# Patient Record
Sex: Male | Born: 1960 | Race: White | Hispanic: No | Marital: Married | Smoking: Former smoker
Health system: Southern US, Community
[De-identification: ages and names within clinical notes are randomized; demographics above are authoritative.]

## PROBLEM LIST (undated history)

## (undated) DIAGNOSIS — I10 Essential (primary) hypertension: Secondary | ICD-10-CM

## (undated) DIAGNOSIS — I2510 Atherosclerotic heart disease of native coronary artery without angina pectoris: Secondary | ICD-10-CM

## (undated) DIAGNOSIS — K219 Gastro-esophageal reflux disease without esophagitis: Secondary | ICD-10-CM

## (undated) DIAGNOSIS — C801 Malignant (primary) neoplasm, unspecified: Secondary | ICD-10-CM

## (undated) HISTORY — PX: EYE SURGERY: SHX253

## (undated) HISTORY — PX: CATARACT EXTRACTION: SUR2

## (undated) HISTORY — PX: SHOULDER SURGERY: SHX246

## (undated) HISTORY — PX: OTHER SURGICAL HISTORY: SHX169

## (undated) HISTORY — PX: VASECTOMY: SHX75

## (undated) HISTORY — PX: BACK SURGERY: SHX140

---

## 2000-09-14 ENCOUNTER — Ambulatory Visit (HOSPITAL_COMMUNITY): Admission: RE | Admit: 2000-09-14 | Discharge: 2000-09-14 | Payer: Self-pay | Admitting: Family Medicine

## 2000-09-14 ENCOUNTER — Encounter: Payer: Self-pay | Admitting: Family Medicine

## 2000-11-03 ENCOUNTER — Encounter (HOSPITAL_COMMUNITY): Admission: RE | Admit: 2000-11-03 | Discharge: 2000-12-03 | Payer: Self-pay | Admitting: Neurosurgery

## 2000-11-25 ENCOUNTER — Encounter: Payer: Self-pay | Admitting: Neurosurgery

## 2000-11-25 ENCOUNTER — Inpatient Hospital Stay (HOSPITAL_COMMUNITY): Admission: RE | Admit: 2000-11-25 | Discharge: 2000-11-25 | Payer: Self-pay | Admitting: Neurosurgery

## 2002-08-02 ENCOUNTER — Encounter: Payer: Self-pay | Admitting: Family Medicine

## 2002-08-02 ENCOUNTER — Ambulatory Visit (HOSPITAL_COMMUNITY): Admission: RE | Admit: 2002-08-02 | Discharge: 2002-08-02 | Payer: Self-pay | Admitting: Family Medicine

## 2002-08-08 ENCOUNTER — Ambulatory Visit (HOSPITAL_COMMUNITY): Admission: RE | Admit: 2002-08-08 | Discharge: 2002-08-08 | Payer: Self-pay | Admitting: Family Medicine

## 2004-05-25 ENCOUNTER — Observation Stay (HOSPITAL_COMMUNITY): Admission: EM | Admit: 2004-05-25 | Discharge: 2004-05-27 | Payer: Self-pay | Admitting: *Deleted

## 2008-05-08 ENCOUNTER — Ambulatory Visit (HOSPITAL_COMMUNITY): Admission: RE | Admit: 2008-05-08 | Discharge: 2008-05-08 | Payer: Self-pay | Admitting: Family Medicine

## 2010-09-27 NOTE — Procedures (Signed)
   NAME:  Mark [name], Mark [name]                          ACCOUNT NO.:  [id number]   MEDICAL RECORD NO.:  [id number]                   PATIENT TYPE:  OUT   LOCATION:  [facility]                                 FACILITY:  [facility]   PHYSICIAN:  Scott A. Gerda Diss, M.D.               DATE OF BIRTH:  02-07-1961   DATE OF PROCEDURE:  08/08/2002  DATE OF DISCHARGE:                                    STRESS TEST   INDICATIONS:  Atypical chest pain left side over the past few weeks but has  been doing a lot of weightlifting.  Does have risk factors and family  history.   FINDINGS:  1. Baseline EKG:  No acute changes.  2. ST segment response to exercise:  The patient had some changes that     occurred as the test went on with progressive ST segment depression with     downsloping of V5 and upsloping V4, and flattening in V6.  The patient     did meet criteria of ST segment depression during exercise, felt no     symptomatology.  3. Recovery phase:  Quickly the ST segments returned back to normal over the     course of the next three-and-a-half minutes, were upsloping, and there     were no worrisome progressions.  4. Arrhythmias:  None.  5. Blood pressure response:  The patient did reach maximum of 190/90 blood     pressure.   INTERPRETATION:  Abnormal stress test.  Certainly this could be a false  positive, but given his risk factors I do feel this needs to be checked out  further.  I think a stress Cardiolite would be the next best step.  Will go  ahead and get him set up with cardiology.  He will let us know of the  cardiologist he would like to see and we will assist him with getting that  set up.                                               Scott A. Gerda Diss, M.D.    Linus Orn  D:  08/08/2002  T:  08/08/2002  Job:  [id number]

## 2010-11-21 ENCOUNTER — Encounter (INDEPENDENT_AMBULATORY_CARE_PROVIDER_SITE_OTHER): Payer: Self-pay | Admitting: Internal Medicine

## 2011-02-27 ENCOUNTER — Other Ambulatory Visit (INDEPENDENT_AMBULATORY_CARE_PROVIDER_SITE_OTHER): Payer: Self-pay | Admitting: *Deleted

## 2011-02-27 DIAGNOSIS — Z1211 Encounter for screening for malignant neoplasm of colon: Secondary | ICD-10-CM

## 2011-03-18 MED ORDER — SODIUM CHLORIDE 0.45 % IV SOLN: Freq: Once | INTRAVENOUS | Status: DC

## 2011-03-19 ENCOUNTER — Encounter (INDEPENDENT_AMBULATORY_CARE_PROVIDER_SITE_OTHER): Payer: Self-pay | Admitting: Internal Medicine

## 2011-03-19 ENCOUNTER — Other Ambulatory Visit (INDEPENDENT_AMBULATORY_CARE_PROVIDER_SITE_OTHER): Payer: Self-pay | Admitting: Internal Medicine

## 2011-03-19 ENCOUNTER — Encounter (HOSPITAL_COMMUNITY)
Admission: RE | Disposition: A | Discharge status: Home Health | Payer: Self-pay | Source: Ambulatory Visit | Attending: Internal Medicine

## 2011-03-19 ENCOUNTER — Encounter (HOSPITAL_COMMUNITY): Payer: Self-pay | Admitting: *Deleted

## 2011-03-19 ENCOUNTER — Ambulatory Visit (HOSPITAL_COMMUNITY)
Admission: RE | Admit: 2011-03-19 | Discharge: 2011-03-19 | Disposition: A | Discharge status: Home Health | Payer: PRIVATE HEALTH INSURANCE | Source: Ambulatory Visit | Attending: Internal Medicine | Admitting: Internal Medicine

## 2011-03-19 DIAGNOSIS — D126 Benign neoplasm of colon, unspecified: Secondary | ICD-10-CM | POA: Insufficient documentation

## 2011-03-19 DIAGNOSIS — K644 Residual hemorrhoidal skin tags: Secondary | ICD-10-CM | POA: Insufficient documentation

## 2011-03-19 DIAGNOSIS — Z1211 Encounter for screening for malignant neoplasm of colon: Secondary | ICD-10-CM | POA: Insufficient documentation

## 2011-03-19 DIAGNOSIS — K5730 Diverticulosis of large intestine without perforation or abscess without bleeding: Secondary | ICD-10-CM

## 2011-03-19 HISTORY — DX: Gastro-esophageal reflux disease without esophagitis: K21.9

## 2011-03-19 HISTORY — DX: Malignant (primary) neoplasm, unspecified: C80.1

## 2011-03-19 HISTORY — PX: COLONOSCOPY: SHX5424

## 2011-03-19 SURGERY — COLONOSCOPY
Anesthesia: Moderate Sedation

## 2011-03-19 MED ORDER — MIDAZOLAM HCL 5 MG/5ML IJ SOLN
INTRAMUSCULAR | Status: AC
  Filled 2011-03-19: qty 10

## 2011-03-19 MED ORDER — MIDAZOLAM HCL 5 MG/5ML IJ SOLN
INTRAMUSCULAR | Status: DC | PRN
  Administered 2011-03-19 (×2): 2 mg via INTRAVENOUS
  Administered 2011-03-19 (×2): 1 mg via INTRAVENOUS

## 2011-03-19 MED ORDER — MEPERIDINE HCL 50 MG/ML IJ SOLN
INTRAMUSCULAR | Status: AC
  Filled 2011-03-19: qty 1

## 2011-03-19 MED ORDER — MEPERIDINE HCL 50 MG/ML IJ SOLN
INTRAMUSCULAR | Status: DC | PRN
  Administered 2011-03-19 (×2): 25 mg via INTRAVENOUS

## 2011-03-19 NOTE — H&P (Signed)
Mark [name] is an 50 y.o. male.   Chief Complaint: Patient is here for colonoscopy. HPI: Patient is 50 year old Caucasian male who is here for average risk screening colonoscopy. This is patient's first exam. He denies abdominal pain rectal bleeding or change in his bowel habits. Him history is negative for colorectal carcinoma.  Past Medical History  Diagnosis Date  . GERD (gastroesophageal reflux disease)   . Cancer 15-20 years ago    squamous cell carcinoma off face    Past Surgical History  Procedure Date  . Skin cancer removed from face   . Back surgery     Family History  Problem Relation Age of Onset  . Colon cancer Neg Hx    Social History:  reports that he has quit smoking. He does not have any smokeless tobacco history on file. He reports that he drinks alcohol. He reports that he does not use illicit drugs.  Allergies: No Known Allergies  Medications Prior to Admission  Medication Dose Route Frequency Provider Last Rate Last Dose  . 0.45 % sodium chloride infusion   Intravenous Once Malissa Hippo, MD      . meperidine (DEMEROL) 50 MG/ML injection           . midazolam (VERSED) 5 MG/5ML injection            Medications Prior to Admission  Medication Sig Dispense Refill  . acetaminophen (TYLENOL) 325 MG tablet Take 650 mg by mouth every 6 (six) hours as needed. For pain       . Multiple Vitamins-Minerals (MULTIVITAMINS THER. W/MINERALS) TABS Take 1 tablet by mouth daily.          No results found for this or any previous visit (from the past 48 hour(s)). No results found.  Review of Systems  Constitutional: Negative for weight loss.  Gastrointestinal: Negative for abdominal pain, diarrhea, constipation, blood in stool and melena.    Blood pressure 129/84, pulse 69, temperature 97.9 F (36.6 C), temperature source Oral, resp. rate 12, height 5\' 8"  (1.727 m), weight 171 lb (77.565 kg), SpO2 99.00%. Physical Exam  Constitutional: He appears well-developed  and well-nourished.  HENT:  Mouth/Throat: Oropharynx is clear and moist.  Eyes: Conjunctivae are normal. No scleral icterus.  Neck: No thyromegaly present.  Cardiovascular: Normal rate, regular rhythm and normal heart sounds.   Respiratory: Effort normal and breath sounds normal.  GI: Soft. He exhibits no distension and no mass. There is no tenderness.  Lymphadenopathy:    He has no cervical adenopathy.  Neurological: He is alert.  Skin: Skin is warm and dry.     Assessment/Plan Average risk screening colonoscopy.  Darielle Hancher U 03/19/2011, 9:45 AM

## 2011-03-19 NOTE — Op Note (Signed)
COLONOSCOPY PROCEDURE REPORT  PATIENT:  Mark [name]  MR#:  [id number] Birthdate:  Sep 10, 1960, 50 y.o., male Endoscopist:  Dr. Malissa Hippo, MD Referred By:  Dr. Lilyan Punt, MD Procedure Date: 03/19/2011  Procedure:   Colonoscopy  Indications: Patient is 50 year old Caucasian male who is undergoing average risk screening colonoscopy.  Informed Consent:  Procedure and risks were reviewed with the patient and informed consent was obtained. Medications:  Demerol 50 mg IV Versed 6 mg IV  Description of procedure:  After a digital rectal exam was performed, that colonoscope was advanced from the anus through the rectum and colon to the area of the cecum, ileocecal valve and appendiceal orifice. The cecum was deeply intubated. These structures were well-seen and photographed for the record. From the level of the cecum and ileocecal valve, the scope was slowly and cautiously withdrawn. The mucosal surfaces were carefully surveyed utilizing scope tip to flexion to facilitate fold flattening as needed. The scope was pulled down into the rectum where a thorough exam including retroflexion was performed.  Findings:   Prep excellent. Two small diverticula at sigmoid colon. 4 mm  polyp at mid sigmoid colon. It was ablated via cold biopsy. Small hemorrhoids below the dentate line.  Therapeutic/Diagnostic Maneuvers Performed:  See above  Complications:  None  Cecal Withdrawal Time:  15 minutes  Impression:  Examination performed to cecum. Small polyp ablated via cold biopsy from sigmoid colon. Small diverticula at sigmoid colon and small external hemorrhoids.  Recommendations:  Standard instructions given.  I will be contacting patient with results of biopsy.  REHMAN,NAJEEB U  03/19/2011 10:17 AM  CC: Dr. Lilyan Punt, MD, MD & Dr. Bonnetta Barry ref. provider found

## 2011-03-26 ENCOUNTER — Encounter (INDEPENDENT_AMBULATORY_CARE_PROVIDER_SITE_OTHER): Payer: Self-pay | Admitting: *Deleted

## 2011-03-28 ENCOUNTER — Encounter (HOSPITAL_COMMUNITY): Payer: Self-pay | Admitting: Internal Medicine

## 2013-06-16 ENCOUNTER — Telehealth: Payer: Self-pay | Admitting: Family Medicine

## 2013-06-16 DIAGNOSIS — Z0000 Encounter for general adult medical examination without abnormal findings: Secondary | ICD-10-CM

## 2013-06-16 DIAGNOSIS — Z79899 Other long term (current) drug therapy: Secondary | ICD-10-CM

## 2013-06-16 DIAGNOSIS — Z125 Encounter for screening for malignant neoplasm of prostate: Secondary | ICD-10-CM

## 2013-06-16 NOTE — Telephone Encounter (Signed)
Bloodwork ordered in the system. Patient was notified.  

## 2013-06-16 NOTE — Telephone Encounter (Signed)
Lipid, liver, PSA, metabolic 7

## 2013-06-16 NOTE — Telephone Encounter (Signed)
Patient needs order for blood work. °

## 2013-06-28 ENCOUNTER — Encounter: Payer: PRIVATE HEALTH INSURANCE | Admitting: Family Medicine

## 2013-07-14 LAB — BASIC METABOLIC PANEL
BUN: 16 mg/dL (ref 6–23)
CHLORIDE: 104 meq/L (ref 96–112)
CO2: 28 mEq/L (ref 19–32)
CREATININE: 0.99 mg/dL (ref 0.50–1.35)
Calcium: 9.3 mg/dL (ref 8.4–10.5)
Glucose, Bld: 102 mg/dL — ABNORMAL HIGH (ref 70–99)
Potassium: 4.4 mEq/L (ref 3.5–5.3)
Sodium: 141 mEq/L (ref 135–145)

## 2013-07-14 LAB — HEPATIC FUNCTION PANEL
ALBUMIN: 4.1 g/dL (ref 3.5–5.2)
ALT: 18 U/L (ref 0–53)
AST: 22 U/L (ref 0–37)
Alkaline Phosphatase: 56 U/L (ref 39–117)
BILIRUBIN TOTAL: 0.2 mg/dL (ref 0.2–1.2)
Bilirubin, Direct: <0.1 mg/dL (ref 0.0–0.3)
Total Protein: 6.8 g/dL (ref 6.0–8.3)

## 2013-07-14 LAB — LIPID PANEL
CHOL/HDL RATIO: 3.5 ratio
Cholesterol: 166 mg/dL (ref 0–200)
HDL: 48 mg/dL (ref >39)
LDL CALC: 105 mg/dL — AB (ref 0–99)
Triglycerides: 67 mg/dL (ref <150)
VLDL: 13 mg/dL (ref 0–40)

## 2013-07-15 LAB — PSA: PSA: 0.47 ng/mL (ref <=4.00)

## 2013-07-19 ENCOUNTER — Encounter: Payer: Self-pay | Admitting: Family Medicine

## 2013-07-19 ENCOUNTER — Ambulatory Visit (INDEPENDENT_AMBULATORY_CARE_PROVIDER_SITE_OTHER): Payer: PRIVATE HEALTH INSURANCE | Admitting: Family Medicine

## 2013-07-19 VITALS — BP 128/80 | Ht 68.0 in | Wt 177.0 lb

## 2013-07-19 DIAGNOSIS — R7309 Other abnormal glucose: Secondary | ICD-10-CM

## 2013-07-19 DIAGNOSIS — R739 Hyperglycemia, unspecified: Secondary | ICD-10-CM

## 2013-07-19 DIAGNOSIS — E785 Hyperlipidemia, unspecified: Secondary | ICD-10-CM

## 2013-07-19 DIAGNOSIS — Z0000 Encounter for general adult medical examination without abnormal findings: Secondary | ICD-10-CM

## 2013-07-19 LAB — POCT GLYCOSYLATED HEMOGLOBIN (HGB A1C): Hemoglobin A1C: 5.6

## 2013-07-19 NOTE — Progress Notes (Signed)
   Subjective:    Patient ID: Mark [name], male    DOB: 1960-09-23, 53 y.o.   MRN: [id number]  HPI Patient is here today for a wellness exam.  He is getting over bronchitis. He went to the urgent care over the weekend and was prescribed ZPAK.  Patient denies any wheezing difficulty breathing currently Dietary safety measures reviewed patient doing well with this Denies any chest pain shortness of breath headaches  Review of Systems  Constitutional: Negative for fever, activity change and appetite change.  HENT: Negative for congestion and rhinorrhea.   Eyes: Negative for discharge.  Respiratory: Negative for cough and wheezing.   Cardiovascular: Negative for chest pain.  Gastrointestinal: Negative for vomiting, abdominal pain and blood in stool.  Genitourinary: Negative for frequency and difficulty urinating.  Musculoskeletal: Negative for neck pain.  Skin: Negative for rash.  Allergic/Immunologic: Negative for environmental allergies and food allergies.  Neurological: Negative for weakness and headaches.  Psychiatric/Behavioral: Negative for agitation.       Objective:   Physical Exam  Constitutional: He appears well-developed and well-nourished.  HENT:  Head: Normocephalic and atraumatic.  Right Ear: External ear normal.  Left Ear: External ear normal.  Nose: Nose normal.  Mouth/Throat: Oropharynx is clear and moist.  Eyes: EOM are normal. Pupils are equal, round, and reactive to light.  Neck: Normal range of motion. Neck supple. No thyromegaly present.  Cardiovascular: Normal rate, regular rhythm and normal heart sounds.   No murmur heard. Pulmonary/Chest: Effort normal and breath sounds normal. No respiratory distress. He has no wheezes.  Abdominal: Soft. Bowel sounds are normal. He exhibits no distension and no mass. There is no tenderness.  Genitourinary: Prostate normal.  Musculoskeletal: Normal range of motion. He exhibits no edema.  Lymphadenopathy:    He has  no cervical adenopathy.  Neurological: He is alert. He exhibits normal muscle tone.  Skin: Skin is warm and dry. No erythema.  Psychiatric: He has a normal mood and affect. His behavior is normal. Judgment normal.          Assessment & Plan:  hypergly- a1C fine- healthy eating rec Cholesterol slightly elevated but his overall risk is less than 2% statin not indicated.  The patient comes in today for a wellness visit.  A review of their health history was completed.  A review of medications was also completed. Any necessary refills were discussed. Sensible healthy diet was discussed. Importance of minimizing excessive salt and carbohydrates was also discussed. Safety was stressed including driving, activities at work and at home where applicable. Importance of regular physical activity for overall health was discussed. Preventative measures appropriate for age were discussed. Time was spent with the patient discussing any concerns they have about their well-being.   Patient has umbilical hernia if he gets worse patient will call us we'll set him up with general surgery for surgery.

## 2013-07-19 NOTE — Patient Instructions (Signed)
Diets for Diabetes, Food Labeling Look at food labels to help you decide how much of a product you can eat. You will want to check the amount of total carbohydrate in a serving to see how the food fits into your meal plan. In the list of ingredients, the ingredient present in the largest amount by weight must be listed first, followed by the other ingredients in descending order. STANDARD OF IDENTITY Most products have a list of ingredients. However, foods that the Food and Drug Administration (FDA) has given a standard of identity do not need a list of ingredients. A standard of identity means that a food must contain certain ingredients if it is called a particular name. Examples are mayonnaise, peanut butter, ketchup, jelly, and cheese. LABELING TERMS There are many terms found on food labels. Some of these terms have specific definitions. Some terms are regulated by the FDA, and the FDA has clearly specified how they can be used. Others are not regulated or well-defined and can be misleading and confusing. SPECIFICALLY DEFINED TERMS Nutritive Sweetener.  A sweetener that contains calories,such as table sugar or honey. Nonnutritive Sweetener.  A sweetener with few or no calories,such as saccharin, aspartame, sucralose, and cyclamate. LABELING TERMS REGULATED BY THE FDA Free.  The product contains only a tiny or small amount of fat, cholesterol, sodium, sugar, or calories. For example, a "fat-free" product will contain less than 0.5 g of fat per serving. Low.  A food described as "low" in fat, saturated fat, cholesterol, sodium, or calories could be eaten fairly often without exceeding dietary guidelines. For example, "low in fat" means no more than 3 g of fat per serving. Lean.  "Lean" and "extra lean" are U.S. Department of Agriculture (USDA) terms for use on meat and poultry products. "Lean" means the product contains less than 10 g of fat, 4 g of saturated fat, and 95 mg of cholesterol  per serving. "Lean" is not as low in fat as a product labeled "low." Extra Lean.  "Extra lean" means the product contains less than 5 g of fat, 2 g of saturated fat, and 95 mg of cholesterol per serving. While "extra lean" has less fat than "lean," it is still higher in fat than a product labeled "low." Reduced, Less, Fewer.  A diet product that contains 25% less of a nutrient or calories than the regular version. For example, hot dogs might be labeled "25% less fat than our regular hot dogs." Light/Lite.  A diet product that contains  fewer calories or  the fat of the original. For example, "light in sodium" means a product with  the usual sodium. More.  One serving contains at least 10% more of the daily value of a vitamin, mineral, or fiber than usual. Good Source Of.  One serving contains 10% to 19% of the daily value for a particular vitamin, mineral, or fiber. Excellent Source Of.  One serving contains 20% or more of the daily value for a particular nutrient. Other terms used might be "high in" or "rich in." Enriched or Fortified.  The product contains added vitamins, minerals, or protein. Nutrition labeling must be used on enriched or fortified foods. Imitation.  The product has been altered so that it is lower in protein, vitamins, or minerals than the usual food,such as imitation peanut butter. Total Fat.  The number listed is the total of all fat found in a serving of the product. Under total fat, food labels must list saturated fat and   trans fat, which are associated with raising bad cholesterol and an increased risk of heart blood vessel disease. °Saturated Fat. °· Mainly fats from animal-based sources. Some examples are red meat, cheese, cream, whole milk, and coconut oil. °Trans Fat. °· Found in some fried snack foods, packaged foods, and fried restaurant foods. It is recommended you eat as close to 0 g of trans fat as possible, since it raises bad cholesterol and lowers  good cholesterol. °Polyunsaturated and Monounsaturated Fats. °· More healthful fats. These fats are from plant sources. °Total Carbohydrate. °· The number of carbohydrate grams in a serving of the product. Under total carbohydrate are listed the other carbohydrate sources, such as dietary fiber and sugars. °Dietary Fiber. °· A carbohydrate from plant sources. °Sugars. °· Sugars listed on the label contain all naturally occurring sugars as well as added sugars. °LABELING TERMS NOT REGULATED BY THE FDA °Sugarless. °· Table sugar (sucrose) has not been added. However, the manufacturer may use another form of sugar in place of sucrose to sweeten the product. For example, sugar alcohols are used to sweeten foods. Sugar alcohols are a form of sugar but are not table sugar. If a product contains sugar alcohols in place of sucrose, it can still be labeled "sugarless." °Low Salt, Salt-Free, Unsalted, No Salt, No Salt Added, Without Added Salt. °· Food that is usually processed with salt has been made without salt. However, the food may contain sodium-containing additives, such as preservatives, leavening agents, or flavorings. °Natural. °· This term has no legal meaning. °Organic. °· Foods that are certified as organic have been inspected and approved by the USDA to ensure they are produced without pesticides, fertilizers containing synthetic ingredients, bioengineering, or ionizing radiation. °Document Released: 05/01/2003 Document Revised: 07/21/2011 Document Reviewed: 11/16/2008 °ExitCare® Patient Information ©2014 [employer]. ° °Diabetes Meal Planning Guide °The diabetes meal planning guide is a tool to help you plan your meals and snacks. It is important for people with diabetes to manage their blood glucose (sugar) levels. Choosing the right foods and the right amounts throughout your day will help control your blood glucose. Eating right can even help you improve your blood pressure and reach or maintain a healthy  weight. °CARBOHYDRATE COUNTING MADE EASY °When you eat carbohydrates, they turn to sugar. This raises your blood glucose level. Counting carbohydrates can help you control this level so you feel better. When you plan your meals by counting carbohydrates, you can have more flexibility in what you eat and balance your medicine with your food intake. °Carbohydrate counting simply means adding up the total amount of carbohydrate grams in your meals and snacks. Try to eat about the same amount at each meal. Foods with carbohydrates are listed below. Each portion below is 1 carbohydrate serving or 15 grams of carbohydrates. Ask your dietician how many grams of carbohydrates you should eat at each meal or snack. °Grains and Starches °· 1 slice bread. °· ½ English muffin or hotdog/hamburger bun. °· ¾ cup cold cereal (unsweetened). °·  cup cooked pasta or rice. °· ½ cup starchy vegetables (corn, potatoes, peas, beans, winter squash). °· 1 tortilla (6 inches). °· ¼ bagel. °· 1 waffle or pancake (size of a CD). °· ½ cup cooked cereal. °· 4 to 6 small crackers. °*Whole grain is recommended. °Fruit °· 1 cup fresh unsweetened berries, melon, papaya, pineapple. °· 1 small fresh fruit. °· ½ banana or mango. °· ½ cup fruit juice (4 oz unsweetened). °· ½ cup canned fruit in natural   juice or water. °· 2 tbs dried fruit. °· 12 to 15 grapes or cherries. °Milk and Yogurt °· 1 cup fat-free or 1% milk. °· 1 cup soy milk. °· 6 oz light yogurt with sugar-free sweetener. °· 6 oz low-fat soy yogurt. °· 6 oz plain yogurt. °Vegetables °· 1 cup raw or ½ cup cooked is counted as 0 carbohydrates or a "free" food. °· If you eat 3 or more servings at 1 meal, count them as 1 carbohydrate serving. °Other Carbohydrates °· ¾ oz chips or pretzels. °· ½ cup ice cream or frozen yogurt. °· ¼ cup sherbet or sorbet. °· 2 inch square cake, no frosting. °· 1 tbs honey, sugar, jam, jelly, or syrup. °· 2 small cookies. °· 3 squares of graham crackers. °· 3 cups  popcorn. °· 6 crackers. °· 1 cup broth-based soup. °· Count 1 cup casserole or other mixed foods as 2 carbohydrate servings. °· Foods with less than 20 calories in a serving may be counted as 0 carbohydrates or a "free" food. °You may want to purchase a book or computer software that lists the carbohydrate gram counts of different foods. In addition, the nutrition facts panel on the labels of the foods you eat are a good source of this information. The label will tell you how big the serving size is and the total number of carbohydrate grams you will be eating per serving. Divide this number by 15 to obtain the number of carbohydrate servings in a portion. Remember, 1 carbohydrate serving equals 15 grams of carbohydrate. °SERVING SIZES °Measuring foods and serving sizes helps you make sure you are getting the right amount of food. The list below tells how big or small some common serving sizes are. °· 1 oz.........4 stacked dice. °· 3 oz.........Deck of cards. °· 1 tsp........Tip of little finger. °· 1 tbs........Thumb. °· 2 tbs........Golf ball. °· ½ cup.......Half of a fist. °· 1 cup........A fist. °SAMPLE DIABETES MEAL PLAN °Below is a sample meal plan that includes foods from the grain and starches, dairy, vegetable, fruit, and meat groups. A dietician can individualize a meal plan to fit your calorie needs and tell you the number of servings needed from each food group. However, controlling the total amount of carbohydrates in your meal or snack is more important than making sure you include all of the food groups at every meal. You may interchange carbohydrate containing foods (dairy, starches, and fruits). °The meal plan below is an example of a 2000 calorie diet using carbohydrate counting. This meal plan has 17 carbohydrate servings. °Breakfast °· 1 cup oatmeal (2 carb servings). °· ¾ cup light yogurt (1 carb serving). °· 1 cup blueberries (1 carb serving). °· ¼ cup almonds. °Snack °· 1 large apple (2 carb  servings). °· 1 low-fat string cheese stick. °Lunch °· Chicken breast salad. °· 1 cup spinach. °· ¼ cup chopped tomatoes. °· 2 oz chicken breast, sliced. °· 2 tbs low-fat Italian dressing. °· 12 whole-wheat crackers (2 carb servings). °· 12 to 15 grapes (1 carb serving). °· 1 cup low-fat milk (1 carb serving). °Snack °· 1 cup carrots. °· ½ cup hummus (1 carb serving). °Dinner °· 3 oz broiled salmon. °· 1 cup brown rice (3 carb servings). °Snack °· 1 ½ cups steamed broccoli (1 carb serving) drizzled with 1 tsp olive oil and lemon juice. °· 1 cup light pudding (2 carb servings). °DIABETES MEAL PLANNING WORKSHEET °Your dietician can use this worksheet to help you decide how many servings   of foods and what types of foods are right for you.  °BREAKFAST °Food Group and Servings / Carb Servings °Grain/Starches __________________________________ °Dairy __________________________________________ °Vegetable ______________________________________ °Fruit ___________________________________________ °Meat __________________________________________ °Fat ____________________________________________ °LUNCH °Food Group and Servings / Carb Servings °Grain/Starches ___________________________________ °Dairy ___________________________________________ °Fruit ____________________________________________ °Meat ___________________________________________ °Fat _____________________________________________ °DINNER °Food Group and Servings / Carb Servings °Grain/Starches ___________________________________ °Dairy ___________________________________________ °Fruit ____________________________________________ °Meat ___________________________________________ °Fat _____________________________________________ °SNACKS °Food Group and Servings / Carb Servings °Grain/Starches ___________________________________ °Dairy ___________________________________________ °Vegetable _______________________________________ °Fruit  ____________________________________________ °Meat ___________________________________________ °Fat _____________________________________________ °DAILY TOTALS °Starches _________________________ °Vegetable ________________________ °Fruit ____________________________ °Dairy ____________________________ °Meat ____________________________ °Fat ______________________________ °Document Released: 01/23/2005 Document Revised: 07/21/2011 Document Reviewed: 12/04/2008 °ExitCare® Patient Information ©2014 [employer]. ° ° °

## 2014-09-05 ENCOUNTER — Ambulatory Visit: Payer: Self-pay

## 2014-09-13 ENCOUNTER — Encounter: Payer: Self-pay | Admitting: Family Medicine

## 2014-09-15 ENCOUNTER — Encounter
Admission: RE | Disposition: A | Discharge status: Home / Self Care | Payer: Self-pay | Source: Ambulatory Visit | Attending: Surgery

## 2014-09-15 ENCOUNTER — Encounter: Payer: Self-pay | Admitting: *Deleted

## 2014-09-15 ENCOUNTER — Ambulatory Visit: Payer: PRIVATE HEALTH INSURANCE | Admitting: Anesthesiology

## 2014-09-15 ENCOUNTER — Ambulatory Visit
Admission: RE | Admit: 2014-09-15 | Discharge: 2014-09-15 | Disposition: A | Discharge status: Home / Self Care | Payer: PRIVATE HEALTH INSURANCE | Source: Ambulatory Visit | Attending: Surgery | Admitting: Surgery

## 2014-09-15 DIAGNOSIS — Z87891 Personal history of nicotine dependence: Secondary | ICD-10-CM | POA: Diagnosis not present

## 2014-09-15 DIAGNOSIS — K429 Umbilical hernia without obstruction or gangrene: Secondary | ICD-10-CM | POA: Diagnosis not present

## 2014-09-15 HISTORY — PX: UMBILICAL HERNIA REPAIR: SHX196

## 2014-09-15 SURGERY — REPAIR, HERNIA, UMBILICAL, ADULT
Anesthesia: General | Wound class: Clean

## 2014-09-15 MED ORDER — BUPIVACAINE HCL 0.25 % IJ SOLN
INTRAMUSCULAR | Status: DC | PRN
  Administered 2014-09-15: 30 mL

## 2014-09-15 MED ORDER — ONDANSETRON HCL 4 MG/2ML IJ SOLN: 4.0000 mg | Freq: Once | INTRAMUSCULAR | Status: DC | PRN

## 2014-09-15 MED ORDER — BUPIVACAINE HCL (PF) 0.25 % IJ SOLN
INTRAMUSCULAR | Status: AC
  Filled 2014-09-15: qty 30

## 2014-09-15 MED ORDER — CEFAZOLIN SODIUM 1-5 GM-% IV SOLN
INTRAVENOUS | Status: AC
  Filled 2014-09-15: qty 50

## 2014-09-15 MED ORDER — LIDOCAINE HCL (CARDIAC) 20 MG/ML IV SOLN
INTRAVENOUS | Status: DC | PRN
  Administered 2014-09-15: 100 mg via INTRAVENOUS

## 2014-09-15 MED ORDER — FENTANYL CITRATE (PF) 100 MCG/2ML IJ SOLN
INTRAMUSCULAR | Status: AC
  Administered 2014-09-15: 25 ug via INTRAVENOUS
  Filled 2014-09-15: qty 2

## 2014-09-15 MED ORDER — FENTANYL CITRATE (PF) 100 MCG/2ML IJ SOLN
INTRAMUSCULAR | Status: DC | PRN
  Administered 2014-09-15 (×2): 50 ug via INTRAVENOUS

## 2014-09-15 MED ORDER — ONDANSETRON HCL 4 MG/2ML IJ SOLN
INTRAMUSCULAR | Status: DC | PRN
  Administered 2014-09-15: 4 mg via INTRAVENOUS

## 2014-09-15 MED ORDER — PHENYLEPHRINE HCL 10 MG/ML IJ SOLN
INTRAMUSCULAR | Status: DC | PRN
  Administered 2014-09-15: 100 ug via INTRAVENOUS

## 2014-09-15 MED ORDER — MIDAZOLAM HCL 2 MG/2ML IJ SOLN
INTRAMUSCULAR | Status: DC | PRN
  Administered 2014-09-15: 2 mg via INTRAVENOUS

## 2014-09-15 MED ORDER — HYDROCODONE-IBUPROFEN 5-200 MG PO TABS: 1.0000 | ORAL_TABLET | Freq: Three times a day (TID) | ORAL | Status: DC | PRN

## 2014-09-15 MED ORDER — PROPOFOL 10 MG/ML IV BOLUS
INTRAVENOUS | Status: DC | PRN
  Administered 2014-09-15: 120 mg via INTRAVENOUS

## 2014-09-15 MED ORDER — DEXAMETHASONE SODIUM PHOSPHATE 4 MG/ML IJ SOLN
INTRAMUSCULAR | Status: DC | PRN
  Administered 2014-09-15: 5 mg via INTRAVENOUS

## 2014-09-15 MED ORDER — GLYCOPYRROLATE 0.2 MG/ML IJ SOLN
INTRAMUSCULAR | Status: DC | PRN
  Administered 2014-09-15: 0.1 mg via INTRAVENOUS

## 2014-09-15 MED ORDER — LACTATED RINGERS IV SOLN
INTRAVENOUS | Status: DC
Start: 2014-09-15 — End: 2014-09-15
  Administered 2014-09-15: 07:00:00 via INTRAVENOUS

## 2014-09-15 MED ORDER — BUPIVACAINE HCL (PF) 0.5 % IJ SOLN
INTRAMUSCULAR | Status: AC
  Filled 2014-09-15: qty 30

## 2014-09-15 MED ORDER — FENTANYL CITRATE (PF) 100 MCG/2ML IJ SOLN
25.0000 ug | INTRAMUSCULAR | Status: AC | PRN
  Administered 2014-09-15 (×4): 25 ug via INTRAVENOUS

## 2014-09-15 MED ORDER — CEFAZOLIN SODIUM 1-5 GM-% IV SOLN
1.0000 g | Freq: Once | INTRAVENOUS | Status: AC
  Administered 2014-09-15: 1 g via INTRAVENOUS

## 2014-09-15 SURGICAL SUPPLY — 28 items
BLADE SURG 15 STRL LF DISP TIS (BLADE) ×1 IMPLANT
BLADE SURG 15 STRL SS (BLADE) ×1
BNDG TENSOPLAST 6X5 (GAUZE/BANDAGES/DRESSINGS) ×2 IMPLANT
CANISTER SUCT 1200ML W/VALVE (MISCELLANEOUS) ×2 IMPLANT
CHLORAPREP W/TINT 26ML (MISCELLANEOUS) ×2 IMPLANT
DRAPE LAPAROTOMY 100X77 ABD (DRAPES) ×2 IMPLANT
ELECT CAUTERY NEEDLE TIP 1.0 (MISCELLANEOUS) ×2
ELECTRODE CAUTERY NEDL TIP 1.0 (MISCELLANEOUS) ×1 IMPLANT
GAUZE SPONGE 4X4 12PLY STRL (GAUZE/BANDAGES/DRESSINGS) ×2 IMPLANT
GLOVE BIO SURGEON STRL SZ7.5 (GLOVE) ×2 IMPLANT
GLOVE INDICATOR 8.0 STRL GRN (GLOVE) ×2 IMPLANT
GOWN STRL REUS W/ TWL LRG LVL3 (GOWN DISPOSABLE) ×2 IMPLANT
GOWN STRL REUS W/TWL LRG LVL3 (GOWN DISPOSABLE) ×2
LABEL OR SOLS (LABEL) ×2 IMPLANT
NDL SAFETY 25GX1.5 (NEEDLE) ×2 IMPLANT
NS IRRIG 500ML POUR BTL (IV SOLUTION) ×2 IMPLANT
PACK BASIN MINOR ARMC (MISCELLANEOUS) ×2 IMPLANT
PAD GROUND ADULT SPLIT (MISCELLANEOUS) ×2 IMPLANT
STAPLER SKIN PROX 35W (STAPLE) ×2 IMPLANT
STRAP SAFETY BODY (MISCELLANEOUS) ×2 IMPLANT
STRIP CLOSURE SKIN 1/2X4 (GAUZE/BANDAGES/DRESSINGS) ×2 IMPLANT
SUT SILK 3 0 (SUTURE) ×1
SUT SILK 3-0 18XBRD TIE 12 (SUTURE) ×1 IMPLANT
SUT SURGILON 0 30 BLK (SUTURE) ×4 IMPLANT
SUT VIC AB 0 CT2 27 (SUTURE) ×2 IMPLANT
SUT VICRYL+ 3-0 144IN (SUTURE) ×2 IMPLANT
SUT VICRYL+ 3-0 36IN CT-1 (SUTURE) ×2 IMPLANT
SYRINGE 10CC LL (SYRINGE) ×2 IMPLANT

## 2014-09-15 NOTE — Anesthesia Postprocedure Evaluation (Signed)
  Anesthesia Post-op Note  Patient: Mark [name].  Procedure(s) Performed: Procedure(s): HERNIA REPAIR UMBILICAL ADULT (N/A)  Anesthesia type:General  Patient location: PACU  Post pain: Pain level controlled  Post assessment: Post-op Vital signs reviewed, Patient's Cardiovascular Status Stable, Respiratory Function Stable, Patent Airway and No signs of Nausea or vomiting  Post vital signs: Reviewed and stable  Last Vitals:  Filed Vitals:   09/15/14 0841  BP: 128/94  Pulse: 84  Temp: 36.3 C  Resp: 16    Level of consciousness: awake, alert  and patient cooperative  Complications: No apparent anesthesia complications

## 2014-09-15 NOTE — Discharge Instructions (Signed)
Follow up appointment made:  May 17th @ 2:45 pm in the [facility].  (please call office if you are unable to make your apointment).

## 2014-09-15 NOTE — Op Note (Signed)
09/15/2014  8:37 AM  PATIENT:  Mark [name].  54 y.o. male  PRE-OPERATIVE DIAGNOSIS:  HERNIA  POST-OPERATIVE DIAGNOSIS:  HERNIA umbilical  PROCEDURE:  Procedure(s): HERNIA REPAIR UMBILICAL ADULT (N/A) with the patient in supine position and after the induction of appropriate general anesthesia, the patient's abdomen was prepped with ChloraPrep and draped with sterile towels. An infraumbilical incision was made in the standard fashion and carried down through the subcutaneous tissue using the Bovie electrocautery. The hernia sac and preperitoneal fat were separated from the umbilical skin without difficulty. Because of the large sac and the very small fascial defect, the sac was amputated with the preperitoneal fat. The defect was approximately 4 mm in diameter. The fascial edges were cleaned without difficulty exposing the fascia. A pants over vest closure was utilized using 0 Surgilon. A second layer of 0 Surgilon was utilized in a running fashion. The area was infiltrated with 0.25% Marcaine for postoperative pain control. The umbilical skin was reapproximated to the fascia using a figure-of-eight suture of 0 Vicryl. The subcutaneous space was obliterated with interrupted sutures of 3-0 Vicryl. The skin was clipped and a compressive dressing applied. The patient was returned to the recovery room having tolerated procedure well.  SURGEON:  Surgeon(s) and Role:    * III Dia Crawford, MD - Primary  PHYSICIAN ASSISTANT:   ASSISTANTS: none   ANESTHESIA:   general  EBL:     BLOOD ADMINISTERED:none  DRAINS: none   LOCAL MEDICATIONS USED:  MARCAINE     SPECIMEN:  No Specimen  DISPOSITION OF SPECIMEN:  N/A  COUNTS:  YES  TOURNIQUET:  * No tourniquets in log *  DICTATION: .Dragon Dictation  PLAN OF CARE: Discharge to home after PACU  PATIENT DISPOSITION:  PACU - hemodynamically stable.   Delay start of Pharmacological VTE agent (>24hrs) due to surgical blood loss or risk of  bleeding: not applicable   ELY III, Mark Nuzum, MD

## 2014-09-15 NOTE — Brief Op Note (Signed)
09/15/2014  8:35 AM  PATIENT:  Mark [name].  54 y.o. male  PRE-OPERATIVE DIAGNOSIS:  HERNIA  POST-OPERATIVE DIAGNOSIS:  HERNIA umbilical  PROCEDURE:  Procedure(s): HERNIA REPAIR UMBILICAL ADULT (N/A)  SURGEON:  Surgeon(s) and Role:    * III Dia Crawford, MD - nonePrimary  PHYSICIAN ASSISTANT:   ASSISTANTS:    ANESTHESIA:   general  EBL:     BLOOD ADMINISTERED:none  DRAINS: none   LOCAL MEDICATIONS USED:  MARCAINE     SPECIMEN:  No Specimen  DISPOSITION OF SPECIMEN:  N/A  COUNTS:  YES  TOURNIQUET:  * No tourniquets in log *  DICTATION: .Dragon Dictation  PLAN OF CARE: Discharge to home after PACU  PATIENT DISPOSITION:  PACU - hemodynamically stable.   Delay start of Pharmacological VTE agent (>24hrs) due to surgical blood loss or risk of bleeding: not applicable

## 2014-09-15 NOTE — H&P (Signed)
Subjective:     Mark [name]. is a 54 y.o. male who presents for evaluation of an umbilical hernia. Pain pattern is is associated with activities Onset was 10 weeks ago. Symptoms have been gradually worsening since. Aggravating factors: increased activity . Associated symptoms: The patient denies other associated symptoms.. The patient denies nausea, vomiting other GI sx.  Patient History:  See old chart note 2/25  Review of Systems A comprehensive review of systems was negative.    Objective:    BP 148/87 mmHg  Pulse 73  Temp(Src) 97.9 F (36.6 C) (Tympanic)  Ht 5\' 8"  (1.727 m)  Wt 78.926 kg (174 lb)  BMI 26.46 kg/m2  SpO2 100%  General:  cooperative  Skin:  normal  Eyes: negative,  Mouth: MMM no lesions  Lymph Nodes:  :Cervical, supraclavicular, and axillary nodes normal.  Lungs:  clear to auscultation bilaterally  Heart:  regular rate and rhythm, S1, S2 normal, no murmur, click, rub or gallop  Abdomen: abnormal findings:  umbilical hernia and .  CVA:  absent  Genitourinary: defer exam  Extremities:  extremities normal, atraumatic, no cyanosis or edema  Neurologic:  negative  Psychiatric:  {psych: normal mood, behavior, speech, dress, and thought processes     Assessment:     Umbilical hernia with increasing sx.        Plan:    1. Discussed the risk of surgery including the risks of general anesthetic including MI, CVA, sudden death or even reaction to anesthetic medications. The patient understands the risks, any and all questions were answered to the patient's satisfaction. 2. He and his wife are in agreement 3. Follow up: 2 week. Jeanie Cooks, MD  Date of Surgery Update (To be completed by Attending Surgeon day of surgery.)  There have been no significant clinical changes since the completion of the above H&P.

## 2014-09-15 NOTE — Transfer of Care (Addendum)
Immediate Anesthesia Transfer of Care Note  Patient: Mark [name].  Procedure(s) Performed: Procedure(s): HERNIA REPAIR UMBILICAL ADULT (N/A)  Patient Location: PACU  Anesthesia Type:General  Level of Consciousness: awake, alert  and oriented  Airway & Oxygen Therapy: Patient Spontanous Breathing and Patient connected to face mask oxygen  Post-op Assessment: Report given to RN  Post vital signs: stable  Last Vitals:  Filed Vitals:   09/15/14 0841  BP: 128/94  Pulse: 84  Temp: 36.3 C  Resp: 16    Complications: No apparent anesthesia complications

## 2014-09-15 NOTE — Anesthesia Preprocedure Evaluation (Signed)
Anesthesia Evaluation  Patient identified by MRN, date of birth, ID band Patient awake    Reviewed: Allergy & Precautions, NPO status , Patient's Chart, lab work & pertinent test results  Airway Mallampati: II  TM Distance: >3 FB Neck ROM: Full    Dental  (+) Chipped   Pulmonary former smoker,          Cardiovascular     Neuro/Psych    GI/Hepatic   Endo/Other    Renal/GU      Musculoskeletal   Abdominal   Peds  Hematology   Anesthesia Other Findings   Reproductive/Obstetrics                             Anesthesia Physical Anesthesia Plan  ASA: II  Anesthesia Plan: General   Post-op Pain Management:    Induction: Intravenous  Airway Management Planned: LMA  Additional Equipment:   Intra-op Plan:   Post-operative Plan:   Informed Consent: I have reviewed the patients History and Physical, chart, labs and discussed the procedure including the risks, benefits and alternatives for the proposed anesthesia with the patient or authorized representative who has indicated his/her understanding and acceptance.     Plan Discussed with: CRNA and Surgeon  Anesthesia Plan Comments:         Anesthesia Quick Evaluation

## 2014-09-19 ENCOUNTER — Encounter: Payer: Self-pay | Admitting: Surgery

## 2015-04-09 ENCOUNTER — Telehealth: Payer: Self-pay | Admitting: Family Medicine

## 2015-04-09 DIAGNOSIS — R739 Hyperglycemia, unspecified: Secondary | ICD-10-CM

## 2015-04-09 DIAGNOSIS — E785 Hyperlipidemia, unspecified: Secondary | ICD-10-CM

## 2015-04-09 NOTE — Telephone Encounter (Signed)
Lipid, glucose, hemoglobin A1c-hyperlipidemia, hyperglycemia

## 2015-04-09 NOTE — Telephone Encounter (Signed)
Pt needs labs for appt on 12/5  Last labs 07/14/13 PSA, Lip, Hep, BMP

## 2015-04-10 NOTE — Telephone Encounter (Signed)
Called patient and informed him per Dr.Scott Luking that labs were ordered. Informed patient that he needs to fast prior to blood test and to use [facility] for blood test. Patient verbalized understanding.

## 2015-04-14 ENCOUNTER — Other Ambulatory Visit: Payer: Self-pay | Admitting: Family Medicine

## 2015-04-16 ENCOUNTER — Encounter: Payer: Self-pay | Admitting: Family Medicine

## 2015-04-16 ENCOUNTER — Ambulatory Visit (INDEPENDENT_AMBULATORY_CARE_PROVIDER_SITE_OTHER): Payer: PRIVATE HEALTH INSURANCE | Admitting: Family Medicine

## 2015-04-16 VITALS — BP 114/68 | Ht 68.0 in | Wt 177.6 lb

## 2015-04-16 DIAGNOSIS — E785 Hyperlipidemia, unspecified: Secondary | ICD-10-CM | POA: Diagnosis not present

## 2015-04-16 DIAGNOSIS — Z0000 Encounter for general adult medical examination without abnormal findings: Secondary | ICD-10-CM

## 2015-04-16 LAB — GLUCOSE, RANDOM: Glucose: 103 mg/dL — ABNORMAL HIGH (ref 65–99)

## 2015-04-16 LAB — HEMOGLOBIN A1C
Est. average glucose Bld gHb Est-mCnc: 123 mg/dL
Hgb A1c MFr Bld: 5.9 % — ABNORMAL HIGH (ref 4.8–5.6)

## 2015-04-16 LAB — LIPID PANEL
Chol/HDL Ratio: 3.3 ratio units (ref 0.0–5.0)
Cholesterol, Total: 229 mg/dL — ABNORMAL HIGH (ref 100–199)
HDL: 69 mg/dL (ref >39)
LDL CALC: 145 mg/dL — AB (ref 0–99)
TRIGLYCERIDES: 75 mg/dL (ref 0–149)
VLDL CHOLESTEROL CAL: 15 mg/dL (ref 5–40)

## 2015-04-16 LAB — SPECIMEN STATUS REPORT

## 2015-04-16 NOTE — Progress Notes (Signed)
   Subjective:    Patient ID: Mark Dell., male    DOB: [birth year], 54 y.o.   MRN: [id number]  HPI  The patient comes in today for a wellness visit.    A review of their health history was completed.  A review of medications was also completed.  Any needed refills; no  Eating habits: trying  Falls/  MVA accidents in past few months: none  Regular exercise: yes  Specialist pt sees on regular basis: no  Preventative health issues were discussed.   Additional concerns: no Importance of healthy eating was discussed. Patient states he is trying to be safe as possible with activities.  Review of Systems  Constitutional: Negative for fever, activity change and appetite change.  HENT: Negative for congestion and rhinorrhea.   Eyes: Negative for discharge.  Respiratory: Negative for cough and wheezing.   Cardiovascular: Negative for chest pain.  Gastrointestinal: Negative for vomiting, abdominal pain and blood in stool.  Genitourinary: Negative for frequency and difficulty urinating.  Musculoskeletal: Negative for neck pain.  Skin: Negative for rash.  Allergic/Immunologic: Negative for environmental allergies and food allergies.  Neurological: Negative for weakness and headaches.  Psychiatric/Behavioral: Negative for agitation.       Objective:   Physical Exam  Constitutional: He appears well-developed and well-nourished.  HENT:  Head: Normocephalic and atraumatic.  Right Ear: External ear normal.  Left Ear: External ear normal.  Nose: Nose normal.  Mouth/Throat: Oropharynx is clear and moist.  Eyes: EOM are normal. Pupils are equal, round, and reactive to light.  Neck: Normal range of motion. Neck supple. No thyromegaly present.  Cardiovascular: Normal rate, regular rhythm and normal heart sounds.   No murmur heard. Pulmonary/Chest: Effort normal and breath sounds normal. No respiratory distress. He has no wheezes.  Abdominal: Soft. Bowel sounds are normal. He  exhibits no distension and no mass. There is no tenderness.  Genitourinary: Prostate normal and penis normal.  Musculoskeletal: Normal range of motion. He exhibits no edema.  Lymphadenopathy:    He has no cervical adenopathy.  Neurological: He is alert. He exhibits normal muscle tone.  Skin: Skin is warm and dry. No erythema.  Psychiatric: He has a normal mood and affect. His behavior is normal. Judgment normal.          Assessment & Plan:  Patient not due colonoscopy until 2022 Prostate normal PSA ordered to the lab work Cholesterol profile not as good as it should be counseled patient on eating follow-up 6 months check lipid profile  81 mg aspirin daily recommended if stomach pains then stop the medicine and notify us right away for signs safety dietary measures were discussed in detail

## 2015-04-18 ENCOUNTER — Encounter: Payer: Self-pay | Admitting: Family Medicine

## 2015-04-20 LAB — PSA: Prostate Specific Ag, Serum: 0.5 ng/mL (ref 0.0–4.0)

## 2015-04-20 LAB — SPECIMEN STATUS REPORT

## 2015-12-31 ENCOUNTER — Encounter: Payer: Self-pay | Admitting: Family Medicine

## 2015-12-31 ENCOUNTER — Ambulatory Visit (INDEPENDENT_AMBULATORY_CARE_PROVIDER_SITE_OTHER): Payer: PRIVATE HEALTH INSURANCE | Admitting: Family Medicine

## 2015-12-31 ENCOUNTER — Telehealth: Payer: Self-pay | Admitting: Family Medicine

## 2015-12-31 VITALS — BP 130/90 | Temp 98.0°F | Ht 68.0 in | Wt 168.2 lb

## 2015-12-31 DIAGNOSIS — K219 Gastro-esophageal reflux disease without esophagitis: Secondary | ICD-10-CM | POA: Diagnosis not present

## 2015-12-31 DIAGNOSIS — H43391 Other vitreous opacities, right eye: Secondary | ICD-10-CM | POA: Diagnosis not present

## 2015-12-31 DIAGNOSIS — H538 Other visual disturbances: Secondary | ICD-10-CM | POA: Diagnosis not present

## 2015-12-31 MED ORDER — PANTOPRAZOLE SODIUM 40 MG PO TBEC: 40.0000 mg | DELAYED_RELEASE_TABLET | Freq: Every day | ORAL | 1 refills | Status: DC

## 2015-12-31 NOTE — Telephone Encounter (Signed)
Left message return call to get more information.  12/31/15

## 2015-12-31 NOTE — Progress Notes (Signed)
   Subjective:    Patient ID: Mark [name]., male    DOB: 1960-09-19, 55 y.o.   MRN: [id number]  Hypertension  This is a new problem. The current episode started in the past 7 days. The problem is unchanged. Associated symptoms include blurred vision. There are no associated agents to hypertension. There are no known risk factors for coronary artery disease. Past treatments include nothing. There are no compliance problems.   The patient relates over the course of the past 3 days he is noticed that intermittent blurriness in the right eye with at times difficult time focusing other times he is able to see okay. He describes what sounds like floaters with multiple visual densities that he can see through that, crossed his eyesight on the right thigh. He states over the weekend he did have some sparkles of light in the right eye but he denies any currently. Patient has no other concerns at this time.  Patient denies any type of vail any type of curtain effect on the eyes vision.  Review of Systems  Eyes: Positive for blurred vision.  He denies any chest tightness pressure pain he does relate some reflux symptoms     Objective:   Physical Exam  Pupils responsive to light Snellen 20/40 on the left 20/25 on the right optic disc appears sharp in addition to this based on office exam I find no evidence of peripheral vision loss.  Urgent referral made to [facility].    Assessment & Plan:  Floaters-I do believe that this patient would benefit from being seen by ophthalmology. I doubt that there is a retinal detachment but with him having flashes of light over the weekend that is a potential concern. Certainly if they find a retinal issue they will get retinal specialists involved.   Reflux issues protonix 1 daily over the next few weeks then try to go to every other day for 2-3 weeks then stop lifestyle changes recommended to the patient to minimize reflux issues

## 2015-12-31 NOTE — Telephone Encounter (Signed)
Pt called stating that he has been seeing specs in his right eye. Pt had his blood pressure checked at work and it was 140/90. Pt wants to know if it is something that he needs to have checked. Pt is worried due to his dad having a heart attack early in life. Please advise.

## 2015-12-31 NOTE — Telephone Encounter (Signed)
Patient states if he looks up he can see little specks-started yesterday during church and has continued today- It is  like dirt specks when looking-BP 140/90 -Patient wonders id he should be concerned

## 2015-12-31 NOTE — Telephone Encounter (Signed)
Advised patient he needs office visit to have this checked. Patient scheduled office visit for today.

## 2016-06-16 ENCOUNTER — Telehealth: Payer: Self-pay | Admitting: Family Medicine

## 2016-06-16 DIAGNOSIS — Z79899 Other long term (current) drug therapy: Secondary | ICD-10-CM

## 2016-06-16 DIAGNOSIS — E785 Hyperlipidemia, unspecified: Secondary | ICD-10-CM

## 2016-06-16 DIAGNOSIS — R7303 Prediabetes: Secondary | ICD-10-CM

## 2016-06-16 DIAGNOSIS — Z125 Encounter for screening for malignant neoplasm of prostate: Secondary | ICD-10-CM

## 2016-06-16 NOTE — Telephone Encounter (Signed)
Left message return call 06/16/16 (labs ordered)

## 2016-06-16 NOTE — Telephone Encounter (Signed)
CBC, metabolic 7, liver, lipid, A1c, PSA-prediabetes, hyperlipidemia, screening

## 2016-06-16 NOTE — Telephone Encounter (Signed)
Patient wants to know if he is due for blood work before his appointment on 07/07/16 with Dr. Nicki Reaper.

## 2016-06-16 NOTE — Telephone Encounter (Signed)
Spoke with patient and informed him per Dr.Scott Luking- Labs have been ordered. Patient verbalized understanding.  

## 2016-06-24 LAB — LIPID PANEL
Chol/HDL Ratio: 4.2 ratio units (ref 0.0–5.0)
Cholesterol, Total: 217 mg/dL — ABNORMAL HIGH (ref 100–199)
HDL: 52 mg/dL (ref >39)
LDL CALC: 152 mg/dL — AB (ref 0–99)
Triglycerides: 67 mg/dL (ref 0–149)
VLDL Cholesterol Cal: 13 mg/dL (ref 5–40)

## 2016-06-24 LAB — CBC WITH DIFFERENTIAL/PLATELET
BASOS ABS: 0 10*3/uL (ref 0.0–0.2)
Basos: 1 %
EOS (ABSOLUTE): 0.1 10*3/uL (ref 0.0–0.4)
Eos: 2 %
Hematocrit: 42.3 % (ref 37.5–51.0)
Hemoglobin: 14 g/dL (ref 13.0–17.7)
Immature Grans (Abs): 0 10*3/uL (ref 0.0–0.1)
Immature Granulocytes: 0 %
Lymphocytes Absolute: 1.7 10*3/uL (ref 0.7–3.1)
Lymphs: 31 %
MCH: 31.2 pg (ref 26.6–33.0)
MCHC: 33.1 g/dL (ref 31.5–35.7)
MCV: 94 fL (ref 79–97)
Monocytes Absolute: 0.7 10*3/uL (ref 0.1–0.9)
Monocytes: 13 %
NEUTROS ABS: 2.8 10*3/uL (ref 1.4–7.0)
Neutrophils: 53 %
Platelets: 257 10*3/uL (ref 150–379)
RBC: 4.49 x10E6/uL (ref 4.14–5.80)
RDW: 13.4 % (ref 12.3–15.4)
WBC: 5.3 10*3/uL (ref 3.4–10.8)

## 2016-06-24 LAB — BASIC METABOLIC PANEL
BUN/Creatinine Ratio: 14 (ref 9–20)
BUN: 14 mg/dL (ref 6–24)
CALCIUM: 9.5 mg/dL (ref 8.7–10.2)
CO2: 23 mmol/L (ref 18–29)
CREATININE: 0.97 mg/dL (ref 0.76–1.27)
Chloride: 103 mmol/L (ref 96–106)
GFR calc Af Amer: 100 mL/min/{1.73_m2} (ref >59)
GFR calc non Af Amer: 87 mL/min/{1.73_m2} (ref >59)
Glucose: 96 mg/dL (ref 65–99)
Potassium: 4.4 mmol/L (ref 3.5–5.2)
Sodium: 142 mmol/L (ref 134–144)

## 2016-06-24 LAB — HEPATIC FUNCTION PANEL
ALBUMIN: 4.4 g/dL (ref 3.5–5.5)
ALK PHOS: 74 IU/L (ref 39–117)
ALT: 20 IU/L (ref 0–44)
AST: 23 IU/L (ref 0–40)
BILIRUBIN TOTAL: 0.4 mg/dL (ref 0.0–1.2)
Bilirubin, Direct: 0.12 mg/dL (ref 0.00–0.40)
Total Protein: 7.1 g/dL (ref 6.0–8.5)

## 2016-06-24 LAB — HEMOGLOBIN A1C
ESTIMATED AVERAGE GLUCOSE: 111 mg/dL
Hgb A1c MFr Bld: 5.5 % (ref 4.8–5.6)

## 2016-06-24 LAB — PSA: PROSTATE SPECIFIC AG, SERUM: 0.5 ng/mL (ref 0.0–4.0)

## 2016-07-07 ENCOUNTER — Ambulatory Visit (INDEPENDENT_AMBULATORY_CARE_PROVIDER_SITE_OTHER): Payer: PRIVATE HEALTH INSURANCE | Admitting: Family Medicine

## 2016-07-07 ENCOUNTER — Encounter: Payer: Self-pay | Admitting: Family Medicine

## 2016-07-07 VITALS — BP 132/90 | Ht 66.5 in | Wt 168.0 lb

## 2016-07-07 DIAGNOSIS — Z0000 Encounter for general adult medical examination without abnormal findings: Secondary | ICD-10-CM | POA: Diagnosis not present

## 2016-07-07 NOTE — Progress Notes (Signed)
   Subjective:    Patient ID: Mark Dell., male    DOB: 1961/02/08, 56 y.o.   MRN: [id number]  HPI The patient comes in today for a wellness visit.    A review of their health history was completed.  A review of medications was also completed.  Any needed refills; not on any prescription meds  Eating habits: health conscious. Trying to do better.   Falls/  MVA accidents in past few months: none  Regular exercise: job is very physical. Goes to exercise room at work.   Specialist pt sees on regular basis: none  Preventative health issues were discussed.   Additional concerns: light headed a few times and dizzy. BP has been high.     Review of Systems  Constitutional: Negative for activity change, appetite change and fever.  HENT: Negative for congestion and rhinorrhea.   Eyes: Negative for discharge.  Respiratory: Negative for cough and wheezing.   Cardiovascular: Negative for chest pain.  Gastrointestinal: Negative for abdominal pain, blood in stool and vomiting.  Genitourinary: Negative for difficulty urinating and frequency.  Musculoskeletal: Negative for neck pain.  Skin: Negative for rash.  Allergic/Immunologic: Negative for environmental allergies and food allergies.  Neurological: Negative for weakness and headaches.  Psychiatric/Behavioral: Negative for agitation.       Objective:   Physical Exam  Constitutional: He appears well-developed and well-nourished.  HENT:  Head: Normocephalic and atraumatic.  Right Ear: External ear normal.  Left Ear: External ear normal.  Nose: Nose normal.  Mouth/Throat: Oropharynx is clear and moist.  Eyes: EOM are normal. Pupils are equal, round, and reactive to light.  Neck: Normal range of motion. Neck supple. No thyromegaly present.  Cardiovascular: Normal rate, regular rhythm and normal heart sounds.   No murmur heard. Pulmonary/Chest: Effort normal and breath sounds normal. No respiratory distress. He has no  wheezes.  Abdominal: Soft. Bowel sounds are normal. He exhibits no distension and no mass. There is no tenderness.  Genitourinary: Rectum normal, prostate normal and penis normal. No penile tenderness.  Musculoskeletal: Normal range of motion. He exhibits no edema.  Lymphadenopathy:    He has no cervical adenopathy.  Neurological: He is alert. He exhibits normal muscle tone.  Skin: Skin is warm and dry. No erythema.  Psychiatric: He has a normal mood and affect. His behavior is normal. Judgment normal.          Assessment & Plan:  Adult wellness-complete.wellness physical was conducted today. Importance of diet and exercise were discussed in detail. In addition to this a discussion regarding safety was also covered. We also reviewed over immunizations and gave recommendations regarding current immunization needed for age. In addition to this additional areas were also touched on including: Preventative health exams needed: Colonoscopy Up-to-date  Patient was advised yearly wellness exam  Hyperlipidemia have advised patient to dramatically improve diet and exercise repeat this again in 4-5 months if not significantly improved will need to be on medication  Borderline blood pressure improve diet exercise more often more cardio exercise follow-up in 4 months if not significantly improved will need to be on medication

## 2016-07-07 NOTE — Patient Instructions (Addendum)
DASH Eating Plan DASH stands for "Dietary Approaches to Stop Hypertension." The DASH eating plan is a healthy eating plan that has been shown to reduce high blood pressure (hypertension). Additional health benefits may include reducing the risk of type 2 diabetes mellitus, heart disease, and stroke. The DASH eating plan may also help with weight loss. What do I need to know about the DASH eating plan? For the DASH eating plan, you will follow these general guidelines:  Choose foods with less than 150 milligrams of sodium per serving (as listed on the food label).  Use salt-free seasonings or herbs instead of table salt or sea salt.  Check with your health care provider or pharmacist before using salt substitutes.  Eat lower-sodium products. These are often labeled as "low-sodium" or "no salt added."  Eat fresh foods. Avoid eating a lot of canned foods.  Eat more vegetables, fruits, and low-fat dairy products.  Choose whole grains. Look for the word "whole" as the first word in the ingredient list.  Choose fish and skinless chicken or turkey more often than red meat. Limit fish, poultry, and meat to 6 oz (170 g) each day.  Limit sweets, desserts, sugars, and sugary drinks.  Choose heart-healthy fats.  Eat more home-cooked food and less restaurant, buffet, and fast food.  Limit fried foods.  Do not fry foods. Cook foods using methods such as baking, boiling, grilling, and broiling instead.  When eating at a restaurant, ask that your food be prepared with less salt, or no salt if possible. What foods can I eat? Seek help from a dietitian for individual calorie needs. Grains  Whole grain or whole wheat bread. Brown rice. Whole grain or whole wheat pasta. Quinoa, bulgur, and whole grain cereals. Low-sodium cereals. Corn or whole wheat flour tortillas. Whole grain cornbread. Whole grain crackers. Low-sodium crackers. Vegetables  Fresh or frozen vegetables (raw, steamed, roasted, or  grilled). Low-sodium or reduced-sodium tomato and vegetable juices. Low-sodium or reduced-sodium tomato sauce and paste. Low-sodium or reduced-sodium canned vegetables. Fruits  All fresh, canned (in natural juice), or frozen fruits. Meat and Other Protein Products  Ground beef (85% or leaner), grass-fed beef, or beef trimmed of fat. Skinless chicken or turkey. Ground chicken or turkey. Pork trimmed of fat. All fish and seafood. Eggs. Dried beans, peas, or lentils. Unsalted nuts and seeds. Unsalted canned beans. Dairy  Low-fat dairy products, such as skim or 1% milk, 2% or reduced-fat cheeses, low-fat ricotta or cottage cheese, or plain low-fat yogurt. Low-sodium or reduced-sodium cheeses. Fats and Oils  Tub margarines without trans fats. Light or reduced-fat mayonnaise and salad dressings (reduced sodium). Avocado. Safflower, olive, or canola oils. Natural peanut or almond butter. Other  Unsalted popcorn and pretzels. The items listed above may not be a complete list of recommended foods or beverages. Contact your dietitian for more options.  What foods are not recommended? Grains  White bread. White pasta. White rice. Refined cornbread. Bagels and croissants. Crackers that contain trans fat. Vegetables  Creamed or fried vegetables. Vegetables in a cheese sauce. Regular canned vegetables. Regular canned tomato sauce and paste. Regular tomato and vegetable juices. Fruits  Canned fruit in light or heavy syrup. Fruit juice. Meat and Other Protein Products  Fatty cuts of meat. Ribs, chicken wings, bacon, sausage, bologna, salami, chitterlings, fatback, hot dogs, bratwurst, and packaged luncheon meats. Salted nuts and seeds. Canned beans with salt. Dairy  Whole or 2% milk, cream, half-and-half, and cream cheese. Whole-fat or sweetened yogurt. Full-fat cheeses   or blue cheese. Nondairy creamers and whipped toppings. Processed cheese, cheese spreads, or cheese curds. Condiments  Onion and garlic  salt, seasoned salt, table salt, and sea salt. Canned and packaged gravies. Worcestershire sauce. Tartar sauce. Barbecue sauce. Teriyaki sauce. Soy sauce, including reduced sodium. Steak sauce. Fish sauce. Oyster sauce. Cocktail sauce. Horseradish. Ketchup and mustard. Meat flavorings and tenderizers. Bouillon cubes. Hot sauce. Tabasco sauce. Marinades. Taco seasonings. Relishes. Fats and Oils  Butter, stick margarine, lard, shortening, ghee, and bacon fat. Coconut, palm kernel, or palm oils. Regular salad dressings. Other  Pickles and olives. Salted popcorn and pretzels. The items listed above may not be a complete list of foods and beverages to avoid. Contact your dietitian for more information.  Where can I find more information? National Heart, Lung, and Blood Institute: travelstabloid.com This information is not intended to replace advice given to you by your health care provider. Make sure you discuss any questions you have with your health care provider. Document Released: 04/17/2011 Document Revised: 10/04/2015 Document Reviewed: 03/02/2013 Elsevier Interactive Patient Education  2017 [place].  High Cholesterol High cholesterol is a condition in which the blood has high levels of a white, waxy, fat-like substance (cholesterol). The human body needs small amounts of cholesterol. The liver makes all the cholesterol that the body needs. Extra (excess) cholesterol comes from the food that we eat. Cholesterol is carried from the liver by the blood through the blood vessels. If you have high cholesterol, deposits (plaques) may build up on the walls of your blood vessels (arteries). Plaques make the arteries narrower and stiffer. Cholesterol plaques increase your risk for heart attack and stroke. Work with your health care provider to keep your cholesterol levels in a healthy range. What increases the risk? This condition is more likely to develop in people  who:  Eat foods that are high in animal fat (saturated fat) or cholesterol.  Are overweight.  Are not getting enough exercise.  Have a family history of high cholesterol. What are the signs or symptoms? There are no symptoms of this condition. How is this diagnosed? This condition may be diagnosed from the results of a blood test.  If you are older than age 89, your health care provider may check your cholesterol every 4-6 years.  You may be checked more often if you already have high cholesterol or other risk factors for heart disease. The blood test for cholesterol measures:  "Bad" cholesterol (LDL cholesterol). This is the main type of cholesterol that causes heart disease. The desired level for LDL is less than 100.  "Good" cholesterol (HDL cholesterol). This type helps to protect against heart disease by cleaning the arteries and carrying the LDL away. The desired level for HDL is 60 or higher.  Triglycerides. These are fats that the body can store or burn for energy. The desired number for triglycerides is lower than 150.  Total cholesterol. This is a measure of the total amount of cholesterol in your blood, including LDL cholesterol, HDL cholesterol, and triglycerides. A healthy number is less than 200. How is this treated? This condition is treated with diet changes, lifestyle changes, and medicines. Diet changes  This may include eating more whole grains, fruits, vegetables, nuts, and fish.  This may also include cutting back on red meat and foods that have a lot of added sugar. Lifestyle changes  Changes may include getting at least 40 minutes of aerobic exercise 3 times a week. Aerobic exercises include walking, biking, and swimming.  Aerobic exercise along with a healthy diet can help you maintain a healthy weight.  Changes may also include quitting smoking. Medicines  Medicines are usually given if diet and lifestyle changes have failed to reduce your cholesterol  to healthy levels.  Your health care provider may prescribe a statin medicine. Statin medicines have been shown to reduce cholesterol, which can reduce the risk of heart disease. Follow these instructions at home: Eating and drinking If told by your health care provider:  Eat chicken (without skin), fish, veal, shellfish, ground [place] breast, and round or loin cuts of red meat.  Do not eat fried foods or fatty meats, such as hot dogs and salami.  Eat plenty of fruits, such as apples.  Eat plenty of vegetables, such as broccoli, potatoes, and carrots.  Eat beans, peas, and lentils.  Eat grains such as barley, rice, couscous, and bulgur wheat.  Eat pasta without cream sauces.  Use skim or nonfat milk, and eat low-fat or nonfat yogurt and cheeses.  Do not eat or drink whole milk, cream, ice cream, egg yolks, or hard cheeses.  Do not eat stick margarine or tub margarines that contain trans fats (also called partially hydrogenated oils).  Do not eat saturated tropical oils, such as coconut oil and palm oil.  Do not eat cakes, cookies, crackers, or other baked goods that contain trans fats. General instructions  Exercise as directed by your health care provider. Increase your activity level with activities such as gardening, walking, and taking the stairs.  Take over-the-counter and prescription medicines only as told by your health care provider.  Do not use any products that contain nicotine or tobacco, such as cigarettes and e-cigarettes. If you need help quitting, ask your health care provider.  Keep all follow-up visits as told by your health care provider. This is important. Contact a health care provider if:  You are struggling to maintain a healthy diet or weight.  You need help to start on an exercise program.  You need help to stop smoking. Get help right away if:  You have chest pain.  You have trouble breathing. This information is not intended to replace  advice given to you by your health care provider. Make sure you discuss any questions you have with your health care provider. Document Released: 04/28/2005 Document Revised: 11/24/2015 Document Reviewed: 10/27/2015 Elsevier Interactive Patient Education  2017 [employer].

## 2017-07-08 ENCOUNTER — Telehealth: Payer: Self-pay | Admitting: Family Medicine

## 2017-07-08 DIAGNOSIS — Z1159 Encounter for screening for other viral diseases: Secondary | ICD-10-CM

## 2017-07-08 DIAGNOSIS — E785 Hyperlipidemia, unspecified: Secondary | ICD-10-CM

## 2017-07-08 DIAGNOSIS — Z79899 Other long term (current) drug therapy: Secondary | ICD-10-CM

## 2017-07-08 DIAGNOSIS — Z114 Encounter for screening for human immunodeficiency virus [HIV]: Secondary | ICD-10-CM

## 2017-07-08 DIAGNOSIS — R5383 Other fatigue: Secondary | ICD-10-CM

## 2017-07-08 DIAGNOSIS — Z125 Encounter for screening for malignant neoplasm of prostate: Secondary | ICD-10-CM

## 2017-07-08 NOTE — Telephone Encounter (Signed)
Lipid, liver, metabolic 7, PSA, CBC, HIV antibody, hepatitis C antibody- these last 2 tests are part of CDC recommendation one-time testing

## 2017-07-08 NOTE — Telephone Encounter (Signed)
Please advise.Same?

## 2017-07-08 NOTE — Telephone Encounter (Signed)
Patient is aware 

## 2017-07-08 NOTE — Telephone Encounter (Signed)
Pt is requesting lab orders to be sent over for an upcoming physical. Last lab per epic were: psa,a1c,lipid,hepatic,bmp,and cbc on 06/23/2016.

## 2017-07-19 LAB — BASIC METABOLIC PANEL
BUN/Creatinine Ratio: 13 (ref 9–20)
BUN: 13 mg/dL (ref 6–24)
CALCIUM: 9.6 mg/dL (ref 8.7–10.2)
CHLORIDE: 101 mmol/L (ref 96–106)
CO2: 24 mmol/L (ref 20–29)
Creatinine, Ser: 0.99 mg/dL (ref 0.76–1.27)
GFR calc non Af Amer: 84 mL/min/{1.73_m2} (ref >59)
GFR, EST AFRICAN AMERICAN: 97 mL/min/{1.73_m2} (ref >59)
Glucose: 96 mg/dL (ref 65–99)
POTASSIUM: 4.7 mmol/L (ref 3.5–5.2)
Sodium: 139 mmol/L (ref 134–144)

## 2017-07-19 LAB — HEPATIC FUNCTION PANEL
ALT: 17 IU/L (ref 0–44)
AST: 23 IU/L (ref 0–40)
Albumin: 4.8 g/dL (ref 3.5–5.5)
Alkaline Phosphatase: 65 IU/L (ref 39–117)
BILIRUBIN TOTAL: 0.4 mg/dL (ref 0.0–1.2)
Bilirubin, Direct: 0.11 mg/dL (ref 0.00–0.40)
Total Protein: 7.5 g/dL (ref 6.0–8.5)

## 2017-07-19 LAB — PSA: Prostate Specific Ag, Serum: 0.5 ng/mL (ref 0.0–4.0)

## 2017-07-19 LAB — LIPID PANEL
CHOLESTEROL TOTAL: 230 mg/dL — AB (ref 100–199)
Chol/HDL Ratio: 3.4 ratio (ref 0.0–5.0)
HDL: 68 mg/dL (ref >39)
LDL CALC: 153 mg/dL — AB (ref 0–99)
TRIGLYCERIDES: 45 mg/dL (ref 0–149)
VLDL CHOLESTEROL CAL: 9 mg/dL (ref 5–40)

## 2017-07-19 LAB — CBC WITH DIFFERENTIAL/PLATELET
BASOS: 1 %
Basophils Absolute: 0.1 10*3/uL (ref 0.0–0.2)
EOS (ABSOLUTE): 0.1 10*3/uL (ref 0.0–0.4)
Eos: 3 %
Hematocrit: 43 % (ref 37.5–51.0)
Hemoglobin: 14.2 g/dL (ref 13.0–17.7)
IMMATURE GRANULOCYTES: 0 %
Immature Grans (Abs): 0 10*3/uL (ref 0.0–0.1)
Lymphocytes Absolute: 1.3 10*3/uL (ref 0.7–3.1)
Lymphs: 29 %
MCH: 31.2 pg (ref 26.6–33.0)
MCHC: 33 g/dL (ref 31.5–35.7)
MCV: 95 fL (ref 79–97)
MONOS ABS: 0.6 10*3/uL (ref 0.1–0.9)
Monocytes: 12 %
NEUTROS PCT: 55 %
Neutrophils Absolute: 2.5 10*3/uL (ref 1.4–7.0)
PLATELETS: 248 10*3/uL (ref 150–379)
RBC: 4.55 x10E6/uL (ref 4.14–5.80)
RDW: 13.6 % (ref 12.3–15.4)
WBC: 4.6 10*3/uL (ref 3.4–10.8)

## 2017-07-19 LAB — HEPATITIS C ANTIBODY: Hep C Virus Ab: <0.1 s/co ratio (ref 0.0–0.9)

## 2017-07-19 LAB — HIV ANTIBODY (ROUTINE TESTING W REFLEX): HIV Screen 4th Generation wRfx: NONREACTIVE

## 2017-07-24 ENCOUNTER — Encounter: Payer: Self-pay | Admitting: Family Medicine

## 2017-07-24 ENCOUNTER — Ambulatory Visit: Payer: PRIVATE HEALTH INSURANCE | Admitting: Family Medicine

## 2017-07-24 VITALS — BP 122/82 | Ht 66.5 in | Wt 168.8 lb

## 2017-07-24 DIAGNOSIS — Z0000 Encounter for general adult medical examination without abnormal findings: Secondary | ICD-10-CM

## 2017-07-24 NOTE — Patient Instructions (Signed)

## 2017-07-24 NOTE — Progress Notes (Signed)
   Subjective:    Patient ID: Mark Dell., male    DOB: 05-23-1960, 57 y.o.   MRN: [id number]  HPI The patient comes in today for a wellness visit.    A review of their health history was completed.  A review of medications was also completed.  Any needed refills; none  Eating habits: trying to eat healthy  Falls/  MVA accidents in past few months: none  Regular exercise: fairly regular  Specialist pt sees on regular basis: eye doctor  Preventative health issues were discussed.   Additional concerns: don't have the energy he used to have Patient relates some fatigue and tiredness but denies any excessive fatigue.  States his energy level not quite as good as it used to be.  In the past he used to work all day, home and work outside all day long as well but now he is not able to do that as well.   Review of Systems  Constitutional: Negative for activity change, appetite change and fever.  HENT: Negative for congestion and rhinorrhea.   Eyes: Negative for discharge.       Floaters-followed by optho  Respiratory: Negative for cough and wheezing.   Cardiovascular: Negative for chest pain.  Gastrointestinal: Negative for abdominal pain, blood in stool and vomiting.  Genitourinary: Negative for difficulty urinating and frequency.  Musculoskeletal: Negative for neck pain.  Skin: Negative for rash.  Allergic/Immunologic: Negative for environmental allergies and food allergies.  Neurological: Negative for weakness and headaches.  Psychiatric/Behavioral: Negative for agitation.       Objective:   Physical Exam  Constitutional: He appears well-developed and well-nourished.  HENT:  Head: Normocephalic and atraumatic.  Right Ear: External ear normal.  Left Ear: External ear normal.  Nose: Nose normal.  Mouth/Throat: Oropharynx is clear and moist.  Eyes: Right eye exhibits no discharge. Left eye exhibits no discharge. No scleral icterus.  Neck: Normal range of motion.  Neck supple. No thyromegaly present.  Cardiovascular: Normal rate, regular rhythm and normal heart sounds.  No murmur heard. Pulmonary/Chest: Effort normal and breath sounds normal. No respiratory distress. He has no wheezes.  Abdominal: Soft. Bowel sounds are normal. He exhibits no distension and no mass. There is no tenderness.  Genitourinary: Penis normal.  Musculoskeletal: Normal range of motion. He exhibits no edema.  Lymphadenopathy:    He has no cervical adenopathy.  Neurological: He is alert. He exhibits normal muscle tone. Coordination normal.  Skin: Skin is warm and dry. No erythema.  Psychiatric: He has a normal mood and affect. His behavior is normal. Judgment normal.          Assessment & Plan:  Adult wellness-complete.wellness physical was conducted today. Importance of diet and exercise were discussed in detail. In addition to this a discussion regarding safety was also covered. We also reviewed over immunizations and gave recommendations regarding current immunization needed for age. In addition to this additional areas were also touched on including: Preventative health exams needed: Colonoscopy not due until 2022  Patient does have hyperlipidemia but his risk of heart disease is 5.2% over the course of the next 10 years.  Based on this and discussion with patient I would not recommend statin.  I do recommend healthy eating on a regular basis.  Plus also repeat a lab work again in 1 years time  PSA normal prostate exam normal  Patient was advised yearly wellness exam Patient is followed by the eye doctor for his floaters

## 2019-04-12 DIAGNOSIS — U071 COVID-19: Secondary | ICD-10-CM

## 2019-04-12 HISTORY — DX: COVID-19: U07.1

## 2019-08-02 ENCOUNTER — Telehealth: Payer: Self-pay | Admitting: Family Medicine

## 2019-08-02 ENCOUNTER — Ambulatory Visit: Payer: Self-pay | Admitting: Family Medicine

## 2019-08-02 VITALS — BP 144/90 | Ht 68.0 in | Wt 172.0 lb

## 2019-08-02 DIAGNOSIS — E785 Hyperlipidemia, unspecified: Secondary | ICD-10-CM

## 2019-08-02 DIAGNOSIS — Z125 Encounter for screening for malignant neoplasm of prostate: Secondary | ICD-10-CM

## 2019-08-02 DIAGNOSIS — Z0000 Encounter for general adult medical examination without abnormal findings: Secondary | ICD-10-CM

## 2019-08-02 NOTE — Progress Notes (Signed)
Subjective:     Patient ID: Mark Dell., male   DOB: [birth year], 59 y.o.   MRN: [id number]  HPI Mark [name] for his required wellness visit for his insurance. His PCP is Dr. Sallee Lange, he is due for his annual physical and will schedule this. His colonoscopy is UTD. He reports upcoming cataract surgery in April. Reports increased stress over the last week d/t father in law being hospitalized as well as stress at work. States yesterday had a bad headache and he had one of the nurses on staff check his BP and he states it was high, unsure of the exact number. He denies any hx of elevated BP. Denies any vision changes, N/V, chest pain or shortness of breath. He reports h/a improved after he left work. Reports he does not eat very well, lots of meals on the go. States he gets a lot of physical activity as his job includes a lot of walking [place]. He reports feeling well today.   Past Medical History:  Diagnosis Date  . Cancer ([place]) 15-20 years ago   squamous cell carcinoma off face  . GERD (gastroesophageal reflux disease)    No Known Allergies  Current Outpatient Medications:  .  acetaminophen (TYLENOL) 325 MG tablet, Take 650 mg by mouth every 6 (six) hours as needed. For pain , Disp: , Rfl:  .  aspirin EC 81 MG tablet, Take 81 mg by mouth daily., Disp: , Rfl:  .  Multiple Vitamins-Minerals (ICAPS AREDS 2 PO), Take by mouth., Disp: , Rfl:  .  Multiple Vitamins-Minerals (MULTIVITAMINS THER. W/MINERALS) TABS, Take 1 tablet by mouth daily.  , Disp: , Rfl:  .  Omega-3 Fatty Acids (FISH OIL) 1200 MG CAPS, Take 1 capsule by mouth daily., Disp: , Rfl:    Review of Systems  Constitutional: Negative for chills, fatigue, fever and unexpected weight change.  HENT: Negative for congestion, ear pain, sinus pressure, sinus pain and sore throat.   Eyes: Negative for discharge and visual disturbance.   Respiratory: Negative for cough, shortness of breath and wheezing.   Cardiovascular: Negative for chest pain and leg swelling.  Gastrointestinal: Negative for abdominal pain, blood in stool, constipation, diarrhea, nausea and vomiting.  Genitourinary: Negative for difficulty urinating and hematuria.  Skin: Negative for color change.  Neurological: Negative for dizziness, weakness, light-headedness and headaches.  Hematological: Negative for adenopathy.  All other systems reviewed and are negative.      Objective:   Physical Exam Vitals reviewed.  Constitutional:      General: He is not in acute distress.    Appearance: Normal appearance. He is well-developed.  HENT:     Head: Normocephalic and atraumatic.  Eyes:     General:        Right eye: No discharge.        Left eye: No discharge.  Cardiovascular:     Rate and Rhythm: Normal rate and regular rhythm.     Heart sounds: Normal heart sounds.  Pulmonary:     Effort: Pulmonary effort is normal. No respiratory distress.     Breath sounds: Normal breath sounds.  Musculoskeletal:     Cervical back: Neck supple.  Skin:    General: Skin is warm and dry.  Neurological:     Mental Status: He is alert and oriented to person, place, and time.  Psychiatric:  Mood and Affect: Mood normal.        Behavior: Behavior normal.    Today's Vitals   08/02/19 0941  BP: (!) 144/90  Weight: 172 lb (78 kg)  Height: 5\' 8"  (1.727 m)   Body mass index is 26.15 kg/m.     Assessment:     Participant in health and wellness plan  Elevated BP without diagnosis of hypertension      Plan:     1. Recommend scheduling appt with PCP for annual physical and f/u on his elevated BP.  2. Discussed stress management strategies, as well as increasing exercise and low sodium diet, increasing fruits and vegetables in his diet. DASH diet handout given.  3. He should keep a log of his BP daily to take with him to his PCP for review.  4. F/u  here prn.

## 2019-08-02 NOTE — Telephone Encounter (Signed)
Patient has physical for 4/16 and needing labs done

## 2019-08-02 NOTE — Patient Instructions (Signed)
Please schedule an appointment with your primary for a check up and follow up on your elevated blood pressure.   DASH Eating Plan DASH stands for "Dietary Approaches to Stop Hypertension." The DASH eating plan is a healthy eating plan that has been shown to reduce high blood pressure (hypertension). It may also reduce your risk for type 2 diabetes, heart disease, and stroke. The DASH eating plan may also help with weight loss. What are tips for following this plan?  General guidelines  Avoid eating more than 2,300 mg (milligrams) of salt (sodium) a day. If you have hypertension, you may need to reduce your sodium intake to 1,500 mg a day.  Limit alcohol intake to no more than 1 drink a day for nonpregnant women and 2 drinks a day for men. One drink equals 12 oz of beer, 5 oz of wine, or 1 oz of hard liquor.  Work with your health care provider to maintain a healthy body weight or to lose weight. Ask what an ideal weight is for you.  Get at least 30 minutes of exercise that causes your heart to beat faster (aerobic exercise) most days of the week. Activities may include walking, swimming, or biking.  Work with your health care provider or diet and nutrition specialist (dietitian) to adjust your eating plan to your individual calorie needs. Reading food labels   Check food labels for the amount of sodium per serving. Choose foods with less than 5 percent of the Daily Value of sodium. Generally, foods with less than 300 mg of sodium per serving fit into this eating plan.  To find whole grains, look for the word "whole" as the first word in the ingredient list. Shopping  Buy products labeled as "low-sodium" or "no salt added."  Buy fresh foods. Avoid canned foods and premade or frozen meals. Cooking  Avoid adding salt when cooking. Use salt-free seasonings or herbs instead of table salt or sea salt. Check with your health care provider or pharmacist before using salt substitutes.  Do not  fry foods. Cook foods using healthy methods such as baking, boiling, grilling, and broiling instead.  Cook with heart-healthy oils, such as olive, canola, soybean, or sunflower oil. Meal planning  Eat a balanced diet that includes: ? 5 or more servings of fruits and vegetables each day. At each meal, try to fill half of your plate with fruits and vegetables. ? Up to 6-8 servings of whole grains each day. ? Less than 6 oz of lean meat, poultry, or fish each day. A 3-oz serving of meat is about the same size as a deck of cards. One egg equals 1 oz. ? 2 servings of low-fat dairy each day. ? A serving of nuts, seeds, or beans 5 times each week. ? Heart-healthy fats. Healthy fats called Omega-3 fatty acids are found in foods such as flaxseeds and coldwater fish, like sardines, salmon, and mackerel.  Limit how much you eat of the following: ? Canned or prepackaged foods. ? Food that is high in trans fat, such as fried foods. ? Food that is high in saturated fat, such as fatty meat. ? Sweets, desserts, sugary drinks, and other foods with added sugar. ? Full-fat dairy products.  Do not salt foods before eating.  Try to eat at least 2 vegetarian meals each week.  Eat more home-cooked food and less restaurant, buffet, and fast food.  When eating at a restaurant, ask that your food be prepared with less salt or no  salt, if possible. What foods are recommended? The items listed may not be a complete list. Talk with your dietitian about what dietary choices are best for you. Grains Whole-grain or whole-wheat bread. Whole-grain or whole-wheat pasta. Brown rice. [place]. Bulgur. Whole-grain and low-sodium cereals. Pita bread. Low-fat, low-sodium crackers. Whole-wheat flour tortillas. Vegetables Fresh or frozen vegetables (raw, steamed, roasted, or grilled). Low-sodium or reduced-sodium tomato and vegetable juice. Low-sodium or reduced-sodium tomato sauce and tomato paste. Low-sodium or  reduced-sodium canned vegetables. Fruits All fresh, dried, or frozen fruit. Canned fruit in natural juice (without added sugar). Meat and other protein foods Skinless chicken or [place]. Ground chicken or [place]. Pork with fat trimmed off. Fish and seafood. Egg whites. Dried beans, peas, or lentils. Unsalted nuts, nut butters, and seeds. Unsalted canned beans. Lean cuts of beef with fat trimmed off. Low-sodium, lean deli meat. Dairy Low-fat (1%) or fat-free (skim) milk. Fat-free, low-fat, or reduced-fat cheeses. Nonfat, low-sodium ricotta or cottage cheese. Low-fat or nonfat yogurt. Low-fat, low-sodium cheese. Fats and oils Soft margarine without trans fats. Vegetable oil. Low-fat, reduced-fat, or light mayonnaise and salad dressings (reduced-sodium). Canola, safflower, olive, soybean, and sunflower oils. Avocado. Seasoning and other foods Herbs. Spices. Seasoning mixes without salt. Unsalted popcorn and pretzels. Fat-free sweets. What foods are not recommended? The items listed may not be a complete list. Talk with your dietitian about what dietary choices are best for you. Grains Baked goods made with fat, such as croissants, muffins, or some breads. Dry pasta or rice meal packs. Vegetables Creamed or fried vegetables. Vegetables in a cheese sauce. Regular canned vegetables (not low-sodium or reduced-sodium). Regular canned tomato sauce and paste (not low-sodium or reduced-sodium). Regular tomato and vegetable juice (not low-sodium or reduced-sodium). Angie Fava. Olives. Fruits Canned fruit in a light or heavy syrup. Fried fruit. Fruit in cream or butter sauce. Meat and other protein foods Fatty cuts of meat. Ribs. Fried meat. Berniece Salines. Sausage. Bologna and other processed lunch meats. Salami. Fatback. Hotdogs. Bratwurst. Salted nuts and seeds. Canned beans with added salt. Canned or smoked fish. Whole eggs or egg yolks. Chicken or [place] with skin. Dairy Whole or 2% milk, cream, and half-and-half.  Whole or full-fat cream cheese. Whole-fat or sweetened yogurt. Full-fat cheese. Nondairy creamers. Whipped toppings. Processed cheese and cheese spreads. Fats and oils Butter. Stick margarine. Lard. Shortening. Ghee. Bacon fat. Tropical oils, such as coconut, palm kernel, or palm oil. Seasoning and other foods Salted popcorn and pretzels. Onion salt, garlic salt, seasoned salt, table salt, and sea salt. Worcestershire sauce. Tartar sauce. Barbecue sauce. Teriyaki sauce. Soy sauce, including reduced-sodium. Steak sauce. Canned and packaged gravies. Fish sauce. Oyster sauce. Cocktail sauce. Horseradish that you find on the shelf. Ketchup. Mustard. Meat flavorings and tenderizers. Bouillon cubes. Hot sauce and Tabasco sauce. Premade or packaged marinades. Premade or packaged taco seasonings. Relishes. Regular salad dressings. Where to find more information:  National Heart, Lung, and [place]: https://wilson-eaton.com/  American Heart Association: www.heart.org Summary  The DASH eating plan is a healthy eating plan that has been shown to reduce high blood pressure (hypertension). It may also reduce your risk for type 2 diabetes, heart disease, and stroke.  With the DASH eating plan, you should limit salt (sodium) intake to 2,300 mg a day. If you have hypertension, you may need to reduce your sodium intake to 1,500 mg a day.  When on the DASH eating plan, aim to eat more fresh fruits and vegetables, whole grains, lean proteins, low-fat dairy, and heart-healthy  fats.  Work with your health care provider or diet and nutrition specialist (dietitian) to adjust your eating plan to your individual calorie needs. This information is not intended to replace advice given to you by your health care provider. Make sure you discuss any questions you have with your health care provider. Document Revised: 04/10/2017 Document Reviewed: 04/21/2016 Elsevier Patient Education  2020 [employer].

## 2019-08-02 NOTE — Telephone Encounter (Signed)
Last labs 07/2017: Lipid, Liver, Met 7, PSA, CBC, HIV, Hep C

## 2019-08-03 NOTE — Telephone Encounter (Signed)
Orders put in and pt notified.  

## 2019-08-03 NOTE — Telephone Encounter (Signed)
PSA, metabolic 7, lipid, liver

## 2019-08-16 ENCOUNTER — Other Ambulatory Visit: Payer: Self-pay | Admitting: Family Medicine

## 2019-08-16 LAB — COMPLETE METABOLIC PANEL WITH GFR
AG Ratio: 1.8 (calc) (ref 1.0–2.5)
ALT: 16 U/L (ref 9–46)
AST: 19 U/L (ref 10–35)
Albumin: 4.4 g/dL (ref 3.6–5.1)
Alkaline phosphatase (APISO): 56 U/L (ref 35–144)
BUN: 13 mg/dL (ref 7–25)
CO2: 22 mmol/L (ref 20–32)
Calcium: 9.5 mg/dL (ref 8.6–10.3)
Chloride: 107 mmol/L (ref 98–110)
Creat: 0.81 mg/dL (ref 0.70–1.33)
GFR, Est African American: 113 mL/min/{1.73_m2} (ref >=60)
GFR, Est Non African American: 97 mL/min/{1.73_m2} (ref >=60)
Globulin: 2.5 g/dL (calc) (ref 1.9–3.7)
Glucose, Bld: 93 mg/dL (ref 65–99)
Potassium: 4.2 mmol/L (ref 3.5–5.3)
Sodium: 140 mmol/L (ref 135–146)
Total Bilirubin: 0.4 mg/dL (ref 0.2–1.2)
Total Protein: 6.9 g/dL (ref 6.1–8.1)

## 2019-08-16 LAB — PSA: PSA: 2.7 ng/mL (ref <=4.0)

## 2019-08-16 LAB — CBC
HCT: 40.7 % (ref 38.5–50.0)
Hemoglobin: 13.7 g/dL (ref 13.2–17.1)
MCH: 32 pg (ref 27.0–33.0)
MCHC: 33.7 g/dL (ref 32.0–36.0)
MCV: 95.1 fL (ref 80.0–100.0)
MPV: 10.3 fL (ref 7.5–12.5)
Platelets: 235 10*3/uL (ref 140–400)
RBC: 4.28 10*6/uL (ref 4.20–5.80)
RDW: 12.9 % (ref 11.0–15.0)
WBC: 4.3 10*3/uL (ref 3.8–10.8)

## 2019-08-16 LAB — LIPID PANEL
Cholesterol: 225 mg/dL — ABNORMAL HIGH (ref <200)
HDL: 74 mg/dL (ref >=40)
LDL Cholesterol (Calc): 130 mg/dL (calc) — ABNORMAL HIGH
Non-HDL Cholesterol (Calc): 151 mg/dL (calc) — ABNORMAL HIGH (ref <130)
Total CHOL/HDL Ratio: 3 (calc) (ref <5.0)
Triglycerides: 99 mg/dL (ref <150)

## 2019-08-18 ENCOUNTER — Telehealth: Payer: Self-pay | Admitting: Family Medicine

## 2019-08-18 NOTE — Telephone Encounter (Signed)
Talked with pt via telephone regarding his lab results. Kidney function, liver function, glucose, and blood counts are all normal. PSA normal. His cholesterol is elevated. Discussed that he would likely benefit from being on a cholesterol lowering medication as well as implementing lifestyle changes, heart healthy diet and increased physical activity. Recommend he discuss his results and treatment options with his PCP at his appointment on 4/16. He verbalized understanding.

## 2019-08-26 ENCOUNTER — Encounter: Payer: PRIVATE HEALTH INSURANCE | Admitting: Family Medicine

## 2019-09-07 ENCOUNTER — Other Ambulatory Visit: Payer: Self-pay

## 2019-09-07 ENCOUNTER — Ambulatory Visit (INDEPENDENT_AMBULATORY_CARE_PROVIDER_SITE_OTHER): Payer: PRIVATE HEALTH INSURANCE | Admitting: Family Medicine

## 2019-09-07 VITALS — BP 134/84 | Temp 97.9°F | Ht 67.0 in | Wt 172.0 lb

## 2019-09-07 DIAGNOSIS — E785 Hyperlipidemia, unspecified: Secondary | ICD-10-CM

## 2019-09-07 DIAGNOSIS — Z0000 Encounter for general adult medical examination without abnormal findings: Secondary | ICD-10-CM | POA: Diagnosis not present

## 2019-09-07 NOTE — Progress Notes (Signed)
Subjective:    Patient ID: Mark Dell., male    DOB: 12-18-1960, 59 y.o.   MRN: [id number]  HPI  The patient comes in today for a wellness visit.  Patient's blood pressure been elevated recently he states he is very high strung but he denies feeling overly anxious is very high and he tends to be very hyper  Patient also had recent labs which showed significant elevation of LDL HDL okay  A review of their health history was completed.  A review of medications was also completed.  Any needed refills: none  Eating habits: could be better  Falls/  MVA accidents in past few months: none  Regular exercise: constantly moving/walking at work  Specialist pt sees on regular basis: retina specialists, had eye surgery and then cataracts removed recently  Preventative health issues were discussed.   Additional concerns: high blood pressure. Patient states wife says he is anxious and high strung, his bp has been checked a few times at work and has been fairly high.     Office Visit from 09/07/2019 in [place]  PHQ-2 Total Score  0      PHQ2. Review of Systems  Constitutional: Negative for diaphoresis and fatigue.  HENT: Negative for congestion and rhinorrhea.   Respiratory: Negative for cough and shortness of breath.   Cardiovascular: Negative for chest pain and leg swelling.  Gastrointestinal: Negative for abdominal pain and diarrhea.  Skin: Negative for color change and rash.  Neurological: Negative for dizziness and headaches.  Psychiatric/Behavioral: Negative for behavioral problems and confusion.       Objective:   Physical Exam Vitals reviewed.  Constitutional:      General: He is not in acute distress. HENT:     Head: Normocephalic and atraumatic.  Eyes:     General:        Right eye: No discharge.        Left eye: No discharge.  Neck:     Trachea: No tracheal deviation.  Cardiovascular:     Rate and Rhythm: Normal rate and regular  rhythm.     Heart sounds: Normal heart sounds. No murmur.  Pulmonary:     Effort: Pulmonary effort is normal. No respiratory distress.     Breath sounds: Normal breath sounds.  Lymphadenopathy:     Cervical: No cervical adenopathy.  Skin:    General: Skin is warm and dry.  Neurological:     Mental Status: He is alert.     Coordination: Coordination normal.  Psychiatric:        Behavior: Behavior normal.    Prostate normal for age no hard nodules found  PSA 2 years ago was 0.5 now 2.7 I recommend to repeat this again in 6 months patient understands     Assessment & Plan:  Adult wellness-complete.wellness physical was conducted today. Importance of diet and exercise were discussed in detail.  In addition to this a discussion regarding safety was also covered. We also reviewed over immunizations and gave recommendations regarding current immunization needed for age.  In addition to this additional areas were also touched on including: Preventative health exams needed:  Colonoscopy 2022  Patient was advised yearly wellness exam Blood pressure is a concern for the patient but upon sitting and rechecking it is normal.  Patient did fill out GAD which shows significant issues but he does not feel he needs medication he will discuss it with his wife he feels he is handling things well  I told him that medication is not mandatory it is something that could be optional including low-dose Celexa Patient does have mild hyperlipidemia also has slightly progressive PSA recommend repeating PSA and lipid profile in 6 months with follow-up office visit

## 2019-09-07 NOTE — Patient Instructions (Signed)
DASH Eating Plan DASH stands for "Dietary Approaches to Stop Hypertension." The DASH eating plan is a healthy eating plan that has been shown to reduce high blood pressure (hypertension). It may also reduce your risk for type 2 diabetes, heart disease, and stroke. The DASH eating plan may also help with weight loss. What are tips for following this plan?  General guidelines  Avoid eating more than 2,300 mg (milligrams) of salt (sodium) a day. If you have hypertension, you may need to reduce your sodium intake to 1,500 mg a day.  Limit alcohol intake to no more than 1 drink a day for nonpregnant women and 2 drinks a day for men. One drink equals 12 oz of beer, 5 oz of wine, or 1 oz of hard liquor.  Work with your health care provider to maintain a healthy body weight or to lose weight. Ask what an ideal weight is for you.  Get at least 30 minutes of exercise that causes your heart to beat faster (aerobic exercise) most days of the week. Activities may include walking, swimming, or biking.  Work with your health care provider or diet and nutrition specialist (dietitian) to adjust your eating plan to your individual calorie needs. Reading food labels   Check food labels for the amount of sodium per serving. Choose foods with less than 5 percent of the Daily Value of sodium. Generally, foods with less than 300 mg of sodium per serving fit into this eating plan.  To find whole grains, look for the word "whole" as the first word in the ingredient list. Shopping  Buy products labeled as "low-sodium" or "no salt added."  Buy fresh foods. Avoid canned foods and premade or frozen meals. Cooking  Avoid adding salt when cooking. Use salt-free seasonings or herbs instead of table salt or sea salt. Check with your health care provider or pharmacist before using salt substitutes.  Do not fry foods. Cook foods using healthy methods such as baking, boiling, grilling, and broiling instead.  Cook with  heart-healthy oils, such as olive, canola, soybean, or sunflower oil. Meal planning  Eat a balanced diet that includes: ? 5 or more servings of fruits and vegetables each day. At each meal, try to fill half of your plate with fruits and vegetables. ? Up to 6-8 servings of whole grains each day. ? Less than 6 oz of lean meat, poultry, or fish each day. A 3-oz serving of meat is about the same size as a deck of cards. One egg equals 1 oz. ? 2 servings of low-fat dairy each day. ? A serving of nuts, seeds, or beans 5 times each week. ? Heart-healthy fats. Healthy fats called Omega-3 fatty acids are found in foods such as flaxseeds and coldwater fish, like sardines, salmon, and mackerel.  Limit how much you eat of the following: ? Canned or prepackaged foods. ? Food that is high in trans fat, such as fried foods. ? Food that is high in saturated fat, such as fatty meat. ? Sweets, desserts, sugary drinks, and other foods with added sugar. ? Full-fat dairy products.  Do not salt foods before eating.  Try to eat at least 2 vegetarian meals each week.  Eat more home-cooked food and less restaurant, buffet, and fast food.  When eating at a restaurant, ask that your food be prepared with less salt or no salt, if possible. What foods are recommended? The items listed may not be a complete list. Talk with your dietitian about   what dietary choices are best for you. Grains Whole-grain or whole-wheat bread. Whole-grain or whole-wheat pasta. Brown rice. Oatmeal. Quinoa. Bulgur. Whole-grain and low-sodium cereals. Pita bread. Low-fat, low-sodium crackers. Whole-wheat flour tortillas. Vegetables Fresh or frozen vegetables (raw, steamed, roasted, or grilled). Low-sodium or reduced-sodium tomato and vegetable juice. Low-sodium or reduced-sodium tomato sauce and tomato paste. Low-sodium or reduced-sodium canned vegetables. Fruits All fresh, dried, or frozen fruit. Canned fruit in natural juice (without  added sugar). Meat and other protein foods Skinless chicken or turkey. Ground chicken or turkey. Pork with fat trimmed off. Fish and seafood. Egg whites. Dried beans, peas, or lentils. Unsalted nuts, nut butters, and seeds. Unsalted canned beans. Lean cuts of beef with fat trimmed off. Low-sodium, lean deli meat. Dairy Low-fat (1%) or fat-free (skim) milk. Fat-free, low-fat, or reduced-fat cheeses. Nonfat, low-sodium ricotta or cottage cheese. Low-fat or nonfat yogurt. Low-fat, low-sodium cheese. Fats and oils Soft margarine without trans fats. Vegetable oil. Low-fat, reduced-fat, or light mayonnaise and salad dressings (reduced-sodium). Canola, safflower, olive, soybean, and sunflower oils. Avocado. Seasoning and other foods Herbs. Spices. Seasoning mixes without salt. Unsalted popcorn and pretzels. Fat-free sweets. What foods are not recommended? The items listed may not be a complete list. Talk with your dietitian about what dietary choices are best for you. Grains Baked goods made with fat, such as croissants, muffins, or some breads. Dry pasta or rice meal packs. Vegetables Creamed or fried vegetables. Vegetables in a cheese sauce. Regular canned vegetables (not low-sodium or reduced-sodium). Regular canned tomato sauce and paste (not low-sodium or reduced-sodium). Regular tomato and vegetable juice (not low-sodium or reduced-sodium). Pickles. Olives. Fruits Canned fruit in a light or heavy syrup. Fried fruit. Fruit in cream or butter sauce. Meat and other protein foods Fatty cuts of meat. Ribs. Fried meat. Bacon. Sausage. Bologna and other processed lunch meats. Salami. Fatback. Hotdogs. Bratwurst. Salted nuts and seeds. Canned beans with added salt. Canned or smoked fish. Whole eggs or egg yolks. Chicken or turkey with skin. Dairy Whole or 2% milk, cream, and half-and-half. Whole or full-fat cream cheese. Whole-fat or sweetened yogurt. Full-fat cheese. Nondairy creamers. Whipped toppings.  Processed cheese and cheese spreads. Fats and oils Butter. Stick margarine. Lard. Shortening. Ghee. Bacon fat. Tropical oils, such as coconut, palm kernel, or palm oil. Seasoning and other foods Salted popcorn and pretzels. Onion salt, garlic salt, seasoned salt, table salt, and sea salt. Worcestershire sauce. Tartar sauce. Barbecue sauce. Teriyaki sauce. Soy sauce, including reduced-sodium. Steak sauce. Canned and packaged gravies. Fish sauce. Oyster sauce. Cocktail sauce. Horseradish that you find on the shelf. Ketchup. Mustard. Meat flavorings and tenderizers. Bouillon cubes. Hot sauce and Tabasco sauce. Premade or packaged marinades. Premade or packaged taco seasonings. Relishes. Regular salad dressings. Where to find more information:  National Heart, Lung, and Blood Institute: www.nhlbi.nih.gov  American Heart Association: www.heart.org Summary  The DASH eating plan is a healthy eating plan that has been shown to reduce high blood pressure (hypertension). It may also reduce your risk for type 2 diabetes, heart disease, and stroke.  With the DASH eating plan, you should limit salt (sodium) intake to 2,300 mg a day. If you have hypertension, you may need to reduce your sodium intake to 1,500 mg a day.  When on the DASH eating plan, aim to eat more fresh fruits and vegetables, whole grains, lean proteins, low-fat dairy, and heart-healthy fats.  Work with your health care provider or diet and nutrition specialist (dietitian) to adjust your eating plan to your   individual calorie needs. This information is not intended to replace advice given to you by your health care provider. Make sure you discuss any questions you have with your health care provider. Document Revised: 04/10/2017 Document Reviewed: 04/21/2016 Elsevier Patient Education  2020 [employer] Inc.  

## 2019-09-08 ENCOUNTER — Other Ambulatory Visit: Payer: Self-pay | Admitting: *Deleted

## 2019-09-08 DIAGNOSIS — E785 Hyperlipidemia, unspecified: Secondary | ICD-10-CM

## 2019-09-08 DIAGNOSIS — Z125 Encounter for screening for malignant neoplasm of prostate: Secondary | ICD-10-CM

## 2019-09-08 NOTE — Progress Notes (Signed)
Orders mailed with a note to do mid to late sept and follow up with visit in early october

## 2019-11-25 ENCOUNTER — Ambulatory Visit (INDEPENDENT_AMBULATORY_CARE_PROVIDER_SITE_OTHER): Payer: PRIVATE HEALTH INSURANCE | Admitting: Family Medicine

## 2019-11-25 ENCOUNTER — Other Ambulatory Visit: Payer: Self-pay

## 2019-11-25 VITALS — BP 138/88 | Temp 97.0°F | Wt 172.2 lb

## 2019-11-25 DIAGNOSIS — F909 Attention-deficit hyperactivity disorder, unspecified type: Secondary | ICD-10-CM | POA: Diagnosis not present

## 2019-11-25 NOTE — Progress Notes (Signed)
   Subjective:    Patient ID: Mark Dell., male    DOB: 03-08-1961, 59 y.o.   MRN: [id number]  HPI  Patient arrives to discuss ADHD he wonders if he needs any other medications.  He states he finds himself feeling all the time jacked up.  He feels like he can never sit still.  Denies feeling anxious.  Denies feeling nervous.  Denies nausea fevers vomiting.  Denies feeling depressed.  Works a very busy job.  Patient is concerned if he does not get better he could end up having a heart attack.  Denies chest tightness pressure pain shortness of breath   patient states he stays "jacked up" all the time and would like to try med to calm/settle him down.    Review of Systems  Constitutional: Negative for diaphoresis and fatigue.  HENT: Negative for congestion and rhinorrhea.   Respiratory: Negative for cough and shortness of breath.   Cardiovascular: Negative for chest pain and leg swelling.  Gastrointestinal: Negative for abdominal pain and diarrhea.  Skin: Negative for color change and rash.  Neurological: Negative for dizziness and headaches.  Psychiatric/Behavioral: Negative for behavioral problems and confusion.       Objective:   Physical Exam Lungs clear heart regular HEENT benign blood pressure slightly elevated Best reading 138/88       Assessment & Plan:  Patient was encouraged to reduce caffeine to no more than 2 cups of coffee daily Patient also encouraged to eat healthy stay active Check blood pressure through nurse at work and through the nurse practitioner at work Patient to follow-up with [place] within several months  I do not feel patient has ADHD I doubt he has GAD.  Possibly he might benefit from a low-dose serotonin reuptake inhibitor but I think it would be best for patient to see a therapist for further evaluation of whether or not medication would be helpful Patient somewhat reluctant to do this but is willing to do so  Certainly if they find a medicine  that works well for him and he would like to get it through [place] we will be happy to help prescribe

## 2019-11-25 NOTE — Patient Instructions (Signed)
Please do your lab work September or October  Send [place] blood pressure readings within a few weeks time  We will help set you up with a specialist for further opinion regarding your situation thank you

## 2019-12-08 ENCOUNTER — Encounter: Payer: Self-pay | Admitting: Family Medicine

## 2020-01-24 ENCOUNTER — Ambulatory Visit: Payer: PRIVATE HEALTH INSURANCE | Admitting: Family Medicine

## 2020-01-24 VITALS — BP 126/82

## 2020-01-24 DIAGNOSIS — Z23 Encounter for immunization: Secondary | ICD-10-CM

## 2020-01-24 NOTE — Progress Notes (Signed)
Pt due for tdap. Administered right deltoid. VIS given. No adverse reaction noted.

## 2020-04-23 ENCOUNTER — Ambulatory Visit (INDEPENDENT_AMBULATORY_CARE_PROVIDER_SITE_OTHER): Payer: PRIVATE HEALTH INSURANCE

## 2020-04-23 ENCOUNTER — Ambulatory Visit
Admission: EM | Admit: 2020-04-23 | Discharge: 2020-04-23 | Disposition: A | Discharge status: Home / Self Care | Payer: PRIVATE HEALTH INSURANCE | Attending: Emergency Medicine | Admitting: Emergency Medicine

## 2020-04-23 DIAGNOSIS — W19XXXA Unspecified fall, initial encounter: Secondary | ICD-10-CM

## 2020-04-23 DIAGNOSIS — S4992XA Unspecified injury of left shoulder and upper arm, initial encounter: Secondary | ICD-10-CM

## 2020-04-23 DIAGNOSIS — M25512 Pain in left shoulder: Secondary | ICD-10-CM

## 2020-04-23 NOTE — Discharge Instructions (Signed)
Take OTC Tylenol 500 mg  as needed for pain Follow RICE instruction as attached Follow up with PCP Return or go to ED if you develop any new or worsening of symptoms

## 2020-04-23 NOTE — ED Triage Notes (Signed)
Pt presents with left shoulder injury from fall this morning

## 2020-04-23 NOTE — ED Provider Notes (Addendum)
[place]   [id number] 04/23/20 Arrival Time: 36  Chief Complaint  Patient presents with   Shoulder Injury     SUBJECTIVE: History from: patient and family.  Mark [name]. is a 59 y.o. male who presented to the urgent care with a complaint of left shoulder pain that occurred this morning patient reported he fell this morning.  He localizes the pain to the left shoulder.  He describes the pain as constant and achy.  He has tried OTC medications without relief.  His symptoms are made worse with ROM.  He denies similar symptoms in the past.  Denies chills, fever, nausea, vomiting, diarrhea.  ROS: As per HPI.  All other pertinent ROS negative.     Past Medical History:  Diagnosis Date   Cancer ([place]) 15-20 years ago   squamous cell carcinoma off face   GERD (gastroesophageal reflux disease)    Past Surgical History:  Procedure Laterality Date   BACK SURGERY     COLONOSCOPY  03/19/2011   Procedure: COLONOSCOPY;  Surgeon: Rogene Houston, MD;  [facility];  Service: Endoscopy;  Laterality: N/A;   skin cancer removed from face     UMBILICAL HERNIA REPAIR N/A 09/15/2014   Procedure: HERNIA REPAIR UMBILICAL ADULT;  Surgeon: III Dia Crawford, MD;  Location: [facility] ORS;  Service: General;  Laterality: N/A;   VASECTOMY     No Known Allergies No current facility-administered medications on file prior to encounter.   Current Outpatient Medications on File Prior to Encounter  Medication Sig Dispense Refill   acetaminophen (TYLENOL) 325 MG tablet Take 650 mg by mouth every 6 (six) hours as needed. For pain      aspirin EC 81 MG tablet Take 81 mg by mouth daily.     Multiple Vitamins-Minerals (MULTIVITAMINS THER. W/MINERALS) TABS Take 1 tablet by mouth daily.       Omega-3 Fatty Acids (FISH OIL) 1200 MG CAPS Take 1 capsule by mouth daily.     OVER THE COUNTER MEDICATION Areds eye vitamin     Social History   Socioeconomic History   Marital status:  Single    Spouse name: Not on file   Number of children: Not on file   Years of education: Not on file   Highest education level: Not on file  Occupational History   Not on file  Tobacco Use   Smoking status: Former Smoker    Packs/day: 1.00    Years: 15.00    Pack years: 15.00    Quit date: 09/04/1996    Years since quitting: 23.6   Smokeless tobacco: Never Used  Substance and Sexual Activity   Alcohol use: Yes    Comment: Rarely   Drug use: No   Sexual activity: Yes  Other Topics Concern   Not on file  Social History Narrative   ** Merged History Encounter **       Social Determinants of Health   Financial Resource Strain: Not on file  Food Insecurity: Not on file  Transportation Needs: Not on file  Physical Activity: Not on file  Stress: Not on file  Social Connections: Not on file  Intimate Partner Violence: Not on file   Family History  Problem Relation Age of Onset   Heart disease Father    Colon cancer Neg Hx     OBJECTIVE:  Vitals:   04/23/20 1058  BP: 136/89  Pulse: 77  Resp: 18  Temp: 98.1 F (36.7 C)  Physical Exam Vitals and nursing note reviewed.  Constitutional:      General: He is not in acute distress.    Appearance: Normal appearance. He is normal weight. He is not ill-appearing, toxic-appearing or diaphoretic.  Cardiovascular:     Rate and Rhythm: Normal rate and regular rhythm.     Pulses: Normal pulses.     Heart sounds: Normal heart sounds. No murmur heard. No friction rub. No gallop.   Pulmonary:     Effort: Pulmonary effort is normal. No respiratory distress.     Breath sounds: Normal breath sounds. No stridor. No wheezing, rhonchi or rales.  Chest:     Chest wall: No tenderness.  Musculoskeletal:        General: Tenderness present.     Right shoulder: Normal.     Left shoulder: Tenderness present.     Comments: The left shoulder is without any obvious asymmetry or deformity when compared to the right  shoulder.  There is no ecchymosis, open wound, lesion, warmth.  Limited range of motion due to pain.  Neurovascular status is intact.  Neurological:     Mental Status: He is alert and oriented to person, place, and time.      LABS:  No results found for this or any previous visit (from the past 24 hour(s)).   RADIOLOGY:  DG Shoulder Left  Result Date: 04/23/2020 CLINICAL DATA:  Fall.  Left shoulder injury. EXAM: LEFT SHOULDER - 2+ VIEW COMPARISON:  None. FINDINGS: There is no evidence of fracture or dislocation. There is no evidence of arthropathy or other focal bone abnormality. Soft tissues are unremarkable. IMPRESSION: Negative. Electronically Signed   By: Logan Bores M.D.   On: 04/23/2020 11:19   Left shoulder x-ray is negative for bony abnormality including fracture or dislocation.  I have reviewed the x-ray myself and the radiologist interpretation.  I am in agreement with the radiologist interpretation.    ASSESSMENT & PLAN:  1. Fall, initial encounter   2. Acute pain of left shoulder     No orders of the defined types were placed in this encounter.  Patient is stable at discharge.  He was advised to continue to use Tylenol as needed for pain.  Additional 3 days off work note was given as patient is off this Monday and tomorrow. Discharge instructions  Take OTC Tylenol 500 mg as needed for pain Follow RICE instruction as attached Follow up with PCP Return or go to ED if you develop any new or worsening of symptoms  Reviewed expectations re: course of current medical issues. Questions answered. Outlined signs and symptoms indicating need for more acute intervention. Patient verbalized understanding. After Visit Summary given.   PDMP reviewed during this encounter.       Emerson Monte, FNP 04/23/20 1143    Emerson Monte, FNP 04/23/20 1146    Emerson Monte, FNP 04/23/20 1147

## 2020-05-10 ENCOUNTER — Ambulatory Visit (INDEPENDENT_AMBULATORY_CARE_PROVIDER_SITE_OTHER): Payer: PRIVATE HEALTH INSURANCE | Admitting: Family Medicine

## 2020-05-10 ENCOUNTER — Other Ambulatory Visit: Payer: Self-pay

## 2020-05-10 ENCOUNTER — Encounter: Payer: Self-pay | Admitting: Family Medicine

## 2020-05-10 VITALS — BP 118/74 | HR 95 | Temp 97.7°F | Ht 67.0 in | Wt 179.0 lb

## 2020-05-10 DIAGNOSIS — S4992XA Unspecified injury of left shoulder and upper arm, initial encounter: Secondary | ICD-10-CM | POA: Insufficient documentation

## 2020-05-10 DIAGNOSIS — M25512 Pain in left shoulder: Secondary | ICD-10-CM

## 2020-05-10 DIAGNOSIS — S4992XD Unspecified injury of left shoulder and upper arm, subsequent encounter: Secondary | ICD-10-CM

## 2020-05-10 DIAGNOSIS — M79602 Pain in left arm: Secondary | ICD-10-CM

## 2020-05-10 MED ORDER — NAPROXEN 500 MG PO TABS: 500.0000 mg | ORAL_TABLET | Freq: Two times a day (BID) | ORAL | 0 refills | Status: DC

## 2020-05-10 NOTE — Patient Instructions (Signed)
Shoulder Pain Many things can cause shoulder pain, including:  An injury.  Moving the shoulder in the same way again and again (overuse).  Joint pain (arthritis). Pain can come from:  Swelling and irritation (inflammation) of any part of the shoulder.  An injury to the shoulder joint.  An injury to: ? Tissues that connect muscle to bone (tendons). ? Tissues that connect bones to each other (ligaments). ? Bones. Follow these instructions at home: Watch for changes in your symptoms. Let your doctor know about them. Follow these instructions to help with your pain. If you have a sling:  Wear the sling as told by your doctor. Remove it only as told by your doctor.  Loosen the sling if your fingers: ? Tingle. ? Become numb. ? Turn cold and blue.  Keep the sling clean.  If the sling is not waterproof: ? Do not let it get wet. ? Take the sling off when you shower or bathe. Managing pain, stiffness, and swelling   If told, put ice on the painful area: ? Put ice in a plastic bag. ? Place a towel between your skin and the bag. ? Leave the ice on for 20 minutes, 2-3 times a day. Stop putting ice on if it does not help with the pain.  Squeeze a soft ball or a foam pad as much as possible. This prevents swelling in the shoulder. It also helps to strengthen the arm. General instructions  Take over-the-counter and prescription medicines only as told by your doctor.  Keep all follow-up visits as told by your doctor. This is important. Contact a doctor if:  Your pain gets worse.  Medicine does not help your pain.  You have new pain in your arm, hand, or fingers. Get help right away if:  Your arm, hand, or fingers: ? Tingle. ? Are numb. ? Are swollen. ? Are painful. ? Turn white or blue. Summary  Shoulder pain can be caused by many things. These include injury, moving the shoulder in the same away again and again, and joint pain.  Watch for changes in your symptoms.  Let your doctor know about them.  This condition may be treated with a sling, ice, and pain medicine.  Contact your doctor if the pain gets worse or you have new pain. Get help right away if your arm, hand, or fingers tingle or get numb, swollen, or painful.  Keep all follow-up visits as told by your doctor. This is important. This information is not intended to replace advice given to you by your health care provider. Make sure you discuss any questions you have with your health care provider. Document Revised: 11/10/2017 Document Reviewed: 11/10/2017 Elsevier Patient Education  2020 Elsevier Inc.  

## 2020-05-10 NOTE — Progress Notes (Signed)
Patient ID: Mark Agresta., male    DOB: 06-Mar-1961, 59 y.o.   MRN: [id number]   No chief complaint on file.  Subjective:  CC: left shoulder injury on 04/23/20  This is a new problem.  Presents today with a complaint of left shoulder injury.  Reports on December 13 he was stepping out of his home stepped on an slippery step and when he fell he reached to grab the post and he rolled his shoulder backwards.  He was seen at the urgent care after this fall, they treated him with rest, ice, compression, and elevation.  He reports that he only took Tylenol for the pain.  He reports that the pain remains at 8/10 today.  He has very limited range of motion.  He reports that he has really had no significant improvement in his pain in the last 2 weeks.  He denies fever, chills, any redness or warmth in the shoulder joint.  He is unable to raise his arms to 90 degrees or overhead.   on 12/13 pt states he slipped on steps and rolled his left arm and still having pain in shoulder and arm. Went to urgent care same day and had xray. Tried ibuprofen 800mg  for a couple of days but quit because it made him feel bad.   Pt states he use to take reflux meds and would like to start back on meds. Having acid reflux at night when lying down. Tried otc med but not sure of the name. States it does help but said not to take more than 14 days so he would like a prescription.    Medical History [place] has a past medical history of Cancer ([facility]) (15-20 years ago) and GERD (gastroesophageal reflux disease).   Outpatient Encounter Medications as of 05/10/2020  Medication Sig  . acetaminophen (TYLENOL) 325 MG tablet Take 650 mg by mouth every 6 (six) hours as needed. For pain  . Multiple Vitamins-Minerals (MULTIVITAMINS THER. W/MINERALS) TABS Take 1 tablet by mouth daily.  . naproxen (NAPROSYN) 500 MG tablet Take 1 tablet (500 mg total) by mouth 2 (two) times daily with a meal.  . Omega-3 Fatty Acids (FISH OIL) 1200 MG  CAPS Take 1 capsule by mouth daily.  05/12/2020 OVER THE COUNTER MEDICATION Areds eye vitamin  . [DISCONTINUED] aspirin EC 81 MG tablet Take 81 mg by mouth daily.   No facility-administered encounter medications on file as of 05/10/2020.     Review of Systems  Constitutional: Negative for chills and fever.  HENT: Negative for ear pain.   Respiratory: Negative for shortness of breath.   Cardiovascular: Negative for chest pain.  Gastrointestinal: Negative for abdominal pain.  Musculoskeletal: Positive for joint swelling. Negative for back pain.       Swelling has resolved, fell and grabbed pole and twisting injury to left shoulder. ROM limited, unable to lift about 45 degree from midline. Not able to lift overhead.      Vitals BP 118/74   Pulse 95   Temp 97.7 F (36.5 C)   Ht 5\' 7"  (1.702 m)   Wt 179 lb (81.2 kg)   SpO2 93%   BMI 28.04 kg/m   Objective:   Physical Exam Vitals reviewed.  Cardiovascular:     Rate and Rhythm: Normal rate and regular rhythm.     Heart sounds: Normal heart sounds.  Pulmonary:     Effort: Pulmonary effort is normal.     Breath sounds: Normal breath sounds.  Musculoskeletal:        General: Tenderness and signs of injury present. No swelling or deformity.     Comments: Left shoulder limited ROM, injury on 04/23/20.   Skin:    General: Skin is warm and dry.  Neurological:     General: No focal deficit present.     Mental Status: He is alert.  Psychiatric:        Behavior: Behavior normal.      Assessment and Plan   1. Injury of left shoulder, subsequent encounter - naproxen (NAPROSYN) 500 MG tablet; Take 1 tablet (500 mg total) by mouth 2 (two) times daily with a meal.  Dispense: 30 tablet; Refill: 0 - [place] LT UPPER EXTREM LTD SOFT TISSUE NON VASCULAR   Will order soft tissue ultrasound to evaluate rotator cuff, tendons in the shoulder to the elbow.  He will take naproxen twice per day with food for inflammation, continue with ice.  Agrees  with plan of care discussed today. Understands warning signs to seek further care: chest pain, shortness of pain, any significant changes in health.  Understands  follow-up will be determined once ultrasound results are available and next steps will be determined. Will refer to ortho if injury has occurred.  Dorena Bodo, FNP-C 05/10/2020

## 2020-05-18 ENCOUNTER — Telehealth: Payer: Self-pay | Admitting: *Deleted

## 2020-05-18 NOTE — Telephone Encounter (Signed)
[facility] scheduling left on vm that pt ultrasound needs to be rescheduled. Not able to do that type of ultrasound cannot be done at [facility].

## 2020-05-21 NOTE — Telephone Encounter (Signed)
Mark [name] can you reschedule or do you need a new order

## 2020-05-22 ENCOUNTER — Ambulatory Visit (HOSPITAL_COMMUNITY): Admission: RE | Admit: 2020-05-22 | Payer: PRIVATE HEALTH INSURANCE | Source: Ambulatory Visit

## 2020-05-24 ENCOUNTER — Telehealth: Payer: Self-pay | Admitting: Family Medicine

## 2020-05-24 ENCOUNTER — Ambulatory Visit (HOSPITAL_COMMUNITY): Payer: PRIVATE HEALTH INSURANCE

## 2020-05-24 DIAGNOSIS — S4992XD Unspecified injury of left shoulder and upper arm, subsequent encounter: Secondary | ICD-10-CM

## 2020-05-24 NOTE — Telephone Encounter (Signed)
See other message- patient not wanting to go to [facility] for ultrasound

## 2020-05-24 NOTE — Telephone Encounter (Signed)
Marletta from [place] calling to state that none of the facilities are able to do the US Soft Tissue due to not having a radiologist that can interpret. Pt will need referral to [facility]. Please advise. Thank you  (Pt was seen 05/10/20 for left shoulder injury)

## 2020-05-24 NOTE — Telephone Encounter (Signed)
Please order referral to ortho for left shoulder injury/pain. Thanks, kd

## 2020-05-24 NOTE — Telephone Encounter (Signed)
Referral placed and pt aware   

## 2020-08-28 ENCOUNTER — Ambulatory Visit: Payer: PRIVATE HEALTH INSURANCE | Admitting: Family Medicine

## 2020-08-28 VITALS — BP 130/90 | HR 87 | Ht 68.0 in | Wt 177.0 lb

## 2020-08-28 DIAGNOSIS — Z789 Other specified health status: Secondary | ICD-10-CM

## 2020-08-28 NOTE — Progress Notes (Signed)
Subjective:     Patient ID: Mark Dell., male   DOB: [birth year], 60 y.o.   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 His PCP is Dr. Sallee Lange, last seen for his physical in April 2021. Colonoscopy is due later this year. Pt needs repeat PSA, it was recommended by his PCP to have this repeated after 6 months but this was not completed. He reports he is feeling well, denies any problems or concerns. He reports recent rotator cuff surgery and is going to PT for this which has been helpful. He is working on eating a healthier diet and getting back into exercising as he is able post shoulder surgery.   Past Medical History:  Diagnosis Date  . Cancer ([place]) 15-20 years ago   squamous cell carcinoma off face  . GERD (gastroesophageal reflux disease)    No Known Allergies  Current Outpatient Medications:  Marland Kitchen  Multiple Vitamins-Minerals (MULTIVITAMINS THER. W/MINERALS) TABS, Take 1 tablet by mouth daily., Disp: , Rfl:  .  OVER THE COUNTER MEDICATION, Areds eye vitamin, Disp: , Rfl:  .  acetaminophen (TYLENOL) 325 MG tablet, Take 650 mg by mouth every 6 (six) hours as needed. For pain (Patient not taking: Reported on 08/28/2020), Disp: , Rfl:  .  naproxen (NAPROSYN) 500 MG tablet, Take 1 tablet (500 mg total) by mouth 2 (two) times daily with a meal. (Patient not taking: Reported on 08/28/2020), Disp: 30 tablet, Rfl: 0 .  Omega-3 Fatty Acids (FISH OIL) 1200 MG CAPS, Take 1 capsule by mouth daily. (Patient not taking: Reported on 08/28/2020), Disp: , Rfl:    Review of Systems  Constitutional: Negative for chills, fatigue, fever and unexpected weight change.  HENT: Negative for congestion, ear pain, sinus pressure, sinus pain and sore throat.   Eyes: Negative for discharge and visual disturbance.  Respiratory: Negative for cough, shortness of breath and wheezing.   Cardiovascular: Negative for chest pain and  leg swelling.  Gastrointestinal: Negative for abdominal pain, blood in stool, constipation, diarrhea, nausea and vomiting.  Genitourinary: Negative for difficulty urinating and hematuria.  Skin: Negative for color change.  Neurological: Negative for dizziness, weakness, light-headedness and headaches.  Hematological: Negative for adenopathy.  All other systems reviewed and are negative.      Objective:   Physical Exam Vitals reviewed.  Constitutional:      General: He is not in acute distress.    Appearance: Normal appearance. He is well-developed.  HENT:     Head: Normocephalic and atraumatic.  Eyes:     General:        Right eye: No discharge.        Left eye: No discharge.  Cardiovascular:     Rate and Rhythm: Normal rate and regular rhythm.     Heart sounds: Normal heart sounds.  Pulmonary:     Effort: Pulmonary effort is normal. No respiratory distress.     Breath sounds: Normal breath sounds.  Musculoskeletal:     Cervical back: Neck supple.  Skin:    General: Skin is warm and dry.  Neurological:     Mental Status: He is alert and oriented to person, place, and time.  Psychiatric:        Mood and Affect: Mood normal.        Behavior: Behavior normal.    Today's Vitals   08/28/20 1119  BP: 130/90  Pulse: 87  SpO2:  95%  Weight: 177 lb (80.3 kg)  Height: 5\' 8"  (1.727 m)   Body mass index is 26.91 kg/m.      Assessment:     Participant in health and wellness plan      Plan:     1. Encouraged patient to schedule appt for annual physical with his PCP and have repeat lab work done. He can have labs done here and results forwarded to PCP if he would like. Encouraged continued efforts at healthy eating and increasing physical activity. Reminded that colonoscopy is due later this year.  2. BP slightly elevated, educated on DASH diet.  3. F/u here prn .

## 2020-11-02 ENCOUNTER — Encounter: Payer: Self-pay | Admitting: Emergency Medicine

## 2020-11-02 ENCOUNTER — Ambulatory Visit
Admission: EM | Admit: 2020-11-02 | Discharge: 2020-11-02 | Disposition: A | Discharge status: Home / Self Care | Payer: PRIVATE HEALTH INSURANCE | Attending: Emergency Medicine | Admitting: Emergency Medicine

## 2020-11-02 DIAGNOSIS — J069 Acute upper respiratory infection, unspecified: Secondary | ICD-10-CM

## 2020-11-02 MED ORDER — PREDNISONE 20 MG PO TABS: 20.0000 mg | ORAL_TABLET | Freq: Two times a day (BID) | ORAL | 0 refills | Status: AC

## 2020-11-02 MED ORDER — CETIRIZINE-PSEUDOEPHEDRINE ER 5-120 MG PO TB12: 1.0000 | ORAL_TABLET | Freq: Every day | ORAL | 0 refills | Status: DC

## 2020-11-02 MED ORDER — FLUTICASONE PROPIONATE 50 MCG/ACT NA SUSP: 2.0000 | Freq: Every day | NASAL | 0 refills | Status: DC

## 2020-11-02 MED ORDER — BENZONATATE 100 MG PO CAPS: 100.0000 mg | ORAL_CAPSULE | Freq: Three times a day (TID) | ORAL | 0 refills | Status: DC

## 2020-11-02 NOTE — Discharge Instructions (Addendum)
Declines covid flu at this time Get plenty of rest and push fluids Prednisone prescribed for congestion Tessalon Perles prescribed for cough Zyrtec D for congestion and runny nose Use medications daily for symptom relief Use OTC medications like ibuprofen or tylenol as needed fever or pain Call or go to the ED if you have any new or worsening symptoms such as fever, worsening cough, shortness of breath, chest tightness, chest pain, turning blue, changes in mental status, etc..Marland Kitchen

## 2020-11-02 NOTE — ED Triage Notes (Signed)
Coughing up green phlegm that started a couple of days ago.  Pt states he gets to coughing so bad he cant stop.

## 2020-11-02 NOTE — ED Provider Notes (Signed)
[place]   [id number] 11/02/20 Arrival Time: 8185   CC: COVID symptoms  SUBJECTIVE: History from: patient.  Mark [name]. is a 60 y.o. male who presents with coughing up green phlegm that began a couple of days ago.  Denies sick exposure to COVID, flu or strep.  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  Has tried OTC medication without relief.  Symptoms are made worse with during.  Denies previous symptoms.  Denies fever, chills, fatigue, sinus pain, rhinorrhea, sore throat, SOB, wheezing, chest pain, nausea, changes in bowel or bladder habits.    ROS: As per HPI.  All other pertinent ROS negative.     Past Medical History:  Diagnosis Date   Cancer ([place]) 15-20 years ago   squamous cell carcinoma off face   GERD (gastroesophageal reflux disease)    Past Surgical History:  Procedure Laterality Date   BACK SURGERY     COLONOSCOPY  03/19/2011   Procedure: COLONOSCOPY;  Surgeon: Rogene Houston, MD;  [facility];  Service: Endoscopy;  Laterality: N/A;   SHOULDER SURGERY Left    skin cancer removed from face     UMBILICAL HERNIA REPAIR N/A 09/15/2014   Procedure: HERNIA REPAIR UMBILICAL ADULT;  Surgeon: III Dia Crawford, MD;  Location: [facility] ORS;  Service: General;  Laterality: N/A;   VASECTOMY     No Known Allergies No current facility-administered medications on file prior to encounter.   Current Outpatient Medications on File Prior to Encounter  Medication Sig Dispense Refill   Multiple Vitamins-Minerals (MULTIVITAMINS THER. W/MINERALS) TABS Take 1 tablet by mouth daily.     OVER THE COUNTER MEDICATION Areds eye vitamin     Social History   Socioeconomic History   Marital status: Single    Spouse name: Not on file   Number of children: Not on file   Years of education: Not on file   Highest education level: Not on file  Occupational History   Not on file  Tobacco Use   Smoking status: Former    Packs/day: 1.00    Years: 15.00    Pack  years: 15.00    Types: Cigarettes    Quit date: 09/04/1996    Years since quitting: 24.1   Smokeless tobacco: Never  Substance and Sexual Activity   Alcohol use: Yes    Comment: Rarely   Drug use: No   Sexual activity: Yes  Other Topics Concern   Not on file  Social History Narrative   ** Merged History Encounter **       Social Determinants of Health   Financial Resource Strain: Not on file  Food Insecurity: Not on file  Transportation Needs: Not on file  Physical Activity: Not on file  Stress: Not on file  Social Connections: Not on file  Intimate Partner Violence: Not on file   Family History  Problem Relation Age of Onset   Heart disease Father    Colon cancer Neg Hx     OBJECTIVE:  Vitals:   11/02/20 0956  BP: 137/81  Pulse: 87  Resp: 18  Temp: 98.7 F (37.1 C)  TempSrc: Oral  SpO2: 95%    General appearance: alert; appears mildly fatigued, but nontoxic; speaking in full sentences and tolerating own secretions HEENT: NCAT; Ears: EACs clear, TMs pearly gray; Eyes: PERRL.  EOM grossly intact. Nose: nares patent without rhinorrhea, Throat: oropharynx clear, tonsils non erythematous or enlarged, uvula midline  Neck: supple without LAD Lungs: unlabored respirations,  symmetrical air entry; cough: mild; no respiratory distress; CTAB Heart: regular rate and rhythm.   Skin: warm and dry Psychological: alert and cooperative; normal mood and affect  ASSESSMENT & PLAN:  1. Viral URI with cough    Meds ordered this encounter  Medications   benzonatate (TESSALON) 100 MG capsule    Sig: Take 1 capsule (100 mg total) by mouth every 8 (eight) hours.    Dispense:  21 capsule    Refill:  0    Order Specific Question:   Supervising Provider    Answer:   Raylene Everts [[id number]]   predniSONE (DELTASONE) 20 MG tablet    Sig: Take 1 tablet (20 mg total) by mouth 2 (two) times daily with a meal for 5 days.    Dispense:  10 tablet    Refill:  0    Order Specific  Question:   Supervising Provider    Answer:   Lawson Radar   cetirizine-pseudoephedrine (ZYRTEC-D) 5-120 MG tablet    Sig: Take 1 tablet by mouth daily.    Dispense:  30 tablet    Refill:  0    Order Specific Question:   Supervising Provider    Answer:   Raylene Everts [[id number]]   fluticasone (FLONASE) 50 MCG/ACT nasal spray    Sig: Place 2 sprays into both nostrils daily.    Dispense:  16 g    Refill:  0    Order Specific Question:   Supervising Provider    Answer:   Raylene Everts [[id number]]   Declines covid flu at this time Get plenty of rest and push fluids Prednisone prescribed for congestion Tessalon Perles prescribed for cough Zyrtec D for congestion and runny nose Use medications daily for symptom relief Use OTC medications like ibuprofen or tylenol as needed fever or pain Call or go to the ED if you have any new or worsening symptoms such as fever, worsening cough, shortness of breath, chest tightness, chest pain, turning blue, changes in mental status, etc...   Reviewed expectations re: course of current medical issues. Questions answered. Outlined signs and symptoms indicating need for more acute intervention. Patient verbalized understanding. After Visit Summary given.          Lestine Box, PA-C 11/02/20 1013

## 2021-01-22 ENCOUNTER — Other Ambulatory Visit: Payer: Self-pay | Admitting: Family Medicine

## 2021-01-22 ENCOUNTER — Other Ambulatory Visit: Payer: PRIVATE HEALTH INSURANCE | Admitting: Family Medicine

## 2021-01-22 NOTE — Progress Notes (Signed)
Blood work drawn. Will check CMP, Lipid panel, and PSA. Will send results to PCP and notify pt when available.

## 2021-01-23 LAB — LIPID PANEL
Cholesterol: 248 mg/dL — ABNORMAL HIGH (ref <200)
HDL: 69 mg/dL (ref >=40)
LDL Cholesterol (Calc): 164 mg/dL (calc) — ABNORMAL HIGH
Non-HDL Cholesterol (Calc): 179 mg/dL (calc) — ABNORMAL HIGH (ref <130)
Total CHOL/HDL Ratio: 3.6 (calc) (ref <5.0)
Triglycerides: 59 mg/dL (ref <150)

## 2021-01-23 LAB — COMPLETE METABOLIC PANEL WITH GFR
AG Ratio: 1.5 (calc) (ref 1.0–2.5)
ALT: 20 U/L (ref 9–46)
AST: 25 U/L (ref 10–35)
Albumin: 4.6 g/dL (ref 3.6–5.1)
Alkaline phosphatase (APISO): 67 U/L (ref 35–144)
BUN: 14 mg/dL (ref 7–25)
CO2: 26 mmol/L (ref 20–32)
Calcium: 9.7 mg/dL (ref 8.6–10.3)
Chloride: 104 mmol/L (ref 98–110)
Creat: 0.98 mg/dL (ref 0.70–1.35)
Globulin: 3 g/dL (calc) (ref 1.9–3.7)
Glucose, Bld: 79 mg/dL (ref 65–99)
Potassium: 4.9 mmol/L (ref 3.5–5.3)
Sodium: 139 mmol/L (ref 135–146)
Total Bilirubin: 0.5 mg/dL (ref 0.2–1.2)
Total Protein: 7.6 g/dL (ref 6.1–8.1)
eGFR: 88 mL/min/{1.73_m2} (ref >=60)

## 2021-01-23 LAB — PSA: PSA: 0.54 ng/mL (ref <=4.00)

## 2021-01-23 LAB — EXTRA LAV TOP TUBE

## 2021-01-24 NOTE — Progress Notes (Signed)
Spoke with pt via telephone about his lab results. Cholesterol has increased since last year. Discussed that his current 10-year ASCVD risk is 10.1% and the recommendation is to start on cholesterol-lowering medication in addition to lifestyle changes. He would like to try making dietary changes prior to starting medication. We discussed heart-healthy diet changes and him staying motivated to make these changes. Recommend he implement these dietary measures and recheck fasting lipid in 3-4 months, if no improvement then start on statin. He has an appt with his PCP in early October and can discuss this with him further then. All other lab work looked good today.

## 2021-01-29 ENCOUNTER — Ambulatory Visit: Payer: PRIVATE HEALTH INSURANCE | Admitting: Family Medicine

## 2021-01-29 DIAGNOSIS — Z23 Encounter for immunization: Secondary | ICD-10-CM

## 2021-01-29 NOTE — Progress Notes (Signed)
Shingrix vaccine administered to left deltoid, see immunizations for admin info. Pt tolerated well. VIS given. 2nd dose due in 2-6 months.

## 2021-02-13 ENCOUNTER — Other Ambulatory Visit: Payer: Self-pay

## 2021-02-13 ENCOUNTER — Ambulatory Visit (INDEPENDENT_AMBULATORY_CARE_PROVIDER_SITE_OTHER): Payer: PRIVATE HEALTH INSURANCE | Admitting: Family Medicine

## 2021-02-13 ENCOUNTER — Encounter: Payer: Self-pay | Admitting: Family Medicine

## 2021-02-13 VITALS — BP 118/78 | HR 94 | Temp 98.4°F | Ht 68.0 in | Wt 175.0 lb

## 2021-02-13 DIAGNOSIS — Z0001 Encounter for general adult medical examination with abnormal findings: Secondary | ICD-10-CM | POA: Diagnosis not present

## 2021-02-13 DIAGNOSIS — Z1211 Encounter for screening for malignant neoplasm of colon: Secondary | ICD-10-CM

## 2021-02-13 DIAGNOSIS — E785 Hyperlipidemia, unspecified: Secondary | ICD-10-CM

## 2021-02-13 DIAGNOSIS — Z0000 Encounter for general adult medical examination without abnormal findings: Secondary | ICD-10-CM

## 2021-02-13 MED ORDER — ROSUVASTATIN CALCIUM 20 MG PO TABS: 20.0000 mg | ORAL_TABLET | Freq: Every day | ORAL | 1 refills | Status: DC

## 2021-02-13 NOTE — Progress Notes (Signed)
   Subjective:    Patient ID: Mark Dell., male    DOB: 1960/12/25, 60 y.o.   MRN: [id number]  HPI  The patient comes in today for a wellness visit.    A review of their health history was completed.  A review of medications was also completed.  Any needed refills; no  Eating habits: good  Falls/  MVA accidents in past few months: no  Regular exercise: walk  Specialist pt sees on regular basis: opthalmalmologist- ret[facility] spec  Preventative health issues were discussed.   Additional concerns: no  Review of Systems     Objective:   Physical Exam  General-in no acute distress Eyes-no discharge Lungs-respiratory rate normal, CTA CV-no murmurs,RRR Extremities skin warm dry no edema Neuro grossly normal Behavior normal, alert Prostate exam normal      Assessment & Plan:  1. Well adult exam Adult wellness-complete.wellness physical was conducted today. Importance of diet and exercise were discussed in detail.  In addition to this a discussion regarding safety was also covered. We also reviewed over immunizations and gave recommendations regarding current immunization needed for age.  In addition to this additional areas were also touched on including: Preventative health exams needed:  Colonoscopy commend colonoscopy referral given  Patient was advised yearly wellness exam  - Lipid panel - Hepatic function panel  2. Hyperlipidemia, unspecified hyperlipidemia type Recommend cholesterol medicine at high risk of heart disease patient agrees Has strong family history of heart disease Start Crestor 20 mg daily Side effects were discussed. Check lab work in 8 weeks  - Lipid panel - Hepatic function panel  3. Screening for colon cancer Colonoscopy as per above - Ambulatory referral to Gastroenterology

## 2021-02-13 NOTE — Patient Instructions (Signed)
I sent your medicines to the pharmacy  Please do your lab work in December  Please follow-up in the spring time  Wellness exam in 1 year  TakeCare-Dr. Nicki Reaper

## 2021-02-14 ENCOUNTER — Encounter (INDEPENDENT_AMBULATORY_CARE_PROVIDER_SITE_OTHER): Payer: Self-pay | Admitting: *Deleted

## 2021-05-14 ENCOUNTER — Encounter: Payer: Self-pay | Admitting: Nurse Practitioner

## 2021-05-14 ENCOUNTER — Ambulatory Visit: Payer: Self-pay | Admitting: Nurse Practitioner

## 2021-05-14 ENCOUNTER — Other Ambulatory Visit: Payer: Self-pay

## 2021-05-14 ENCOUNTER — Other Ambulatory Visit: Payer: Self-pay | Admitting: Nurse Practitioner

## 2021-05-14 ENCOUNTER — Ambulatory Visit: Payer: PRIVATE HEALTH INSURANCE | Admitting: Nurse Practitioner

## 2021-05-14 DIAGNOSIS — E7800 Pure hypercholesterolemia, unspecified: Secondary | ICD-10-CM

## 2021-05-14 DIAGNOSIS — Z23 Encounter for immunization: Secondary | ICD-10-CM

## 2021-05-14 LAB — LIPID PANEL
Cholesterol: 188 mg/dL (ref <200)
HDL: 66 mg/dL (ref >=40)
LDL Cholesterol (Calc): 108 mg/dL (calc) — ABNORMAL HIGH
Non-HDL Cholesterol (Calc): 122 mg/dL (calc) (ref <130)
Total CHOL/HDL Ratio: 2.8 (calc) (ref <5.0)
Triglycerides: 55 mg/dL (ref <150)

## 2021-05-14 NOTE — Progress Notes (Addendum)
Established Patient Office Visit  Subjective:  Patient ID: Mark [name]., male    DOB: 02-23-1961  Age: 61 y.o. MRN: [id number]  CC: Recheck cholesterol levels    HPI Mark [name]. presents for lipid panel lab draw. No concerns with his medication today. Pt was seen in clinic at 01/22/21 for lipid panel draw. Started on Rosuvastatin 20 mg PO daily by PCP on 04/01/2021. He denies any new muscle aches or chest pain.   Fasting? Yes   Pt. Is also asking about his second dose of the Shingrix vaccine and if he is eligible to get it. 1st dose done on 01/29/2021.   Past Medical History:  Diagnosis Date   Cancer ([place]) 15-20 years ago   squamous cell carcinoma off face   GERD (gastroesophageal reflux disease)     Past Surgical History:  Procedure Laterality Date   BACK SURGERY     COLONOSCOPY  03/19/2011   Procedure: COLONOSCOPY;  Surgeon: Mark Houston, MD;  [facility];  Service: Endoscopy;  Laterality: N/A;   SHOULDER SURGERY Left    skin cancer removed from face     UMBILICAL HERNIA REPAIR N/A 09/15/2014   Procedure: HERNIA REPAIR UMBILICAL ADULT;  Surgeon: Mark Dia Crawford, MD;  Location: [facility];  Service: General;  Laterality: N/A;   VASECTOMY      Family History  Problem Relation Age of Onset   Heart disease Father    Colon cancer Neg Hx     Social History   Socioeconomic History   Marital status: Married    Spouse name: Not on file   Number of children: Not on file   Years of education: Not on file   Highest education level: Not on file  Occupational History   Not on file  Tobacco Use   Smoking status: Former    Packs/day: 1.00    Years: 15.00    Pack years: 15.00    Types: Cigarettes    Quit date: 09/04/1996    Years since quitting: 24.7   Smokeless tobacco: Never  Substance and Sexual Activity   Alcohol use: Yes    Comment: Rarely   Drug use: No   Sexual activity: Yes  Other Topics Concern   Not on file  Social History Narrative    ** Merged History Encounter **       Social Determinants of Health   Financial Resource Strain: Not on file  Food Insecurity: Not on file  Transportation Needs: Not on file  Physical Activity: Not on file  Stress: Not on file  Social Connections: Not on file  Intimate Partner Violence: Not on file    Outpatient Medications Prior to Visit  Medication Sig Dispense Refill   cetirizine-pseudoephedrine (ZYRTEC-D) 5-120 MG tablet Take 1 tablet by mouth daily. (Patient not taking: Reported on 02/13/2021) 30 tablet 0   Multiple Vitamins-Minerals (MULTIVITAMINS THER. W/MINERALS) TABS Take 1 tablet by mouth daily.     OVER THE COUNTER MEDICATION Areds eye vitamin     rosuvastatin (CRESTOR) 20 MG tablet Take 1 tablet (20 mg total) by mouth daily. 90 tablet 1   No facility-administered medications prior to visit.    No Known Allergies  ROS Review of Systems  Constitutional:  Negative for fatigue.  Respiratory:  Negative for chest tightness and shortness of breath.   Cardiovascular:  Negative for chest pain.  Musculoskeletal:  Negative for myalgias.     Objective:    Physical Exam  Constitutional:      Appearance: Normal appearance.  HENT:     Head: Normocephalic.  Neurological:     Mental Status: He is alert.    There were no vitals taken for this visit. Wt Readings from Last 3 Encounters:  02/13/21 175 lb (79.4 kg)  08/28/20 177 lb (80.3 kg)  05/10/20 179 lb (81.2 kg)     Health Maintenance Due  Topic Date Due   INFLUENZA VACCINE  12/10/2020   COLONOSCOPY (Pts 45-61yr Insurance coverage will need to be confirmed)  03/18/2021   Zoster Vaccines- Shingrix (2 of 2) 03/26/2021    There are no preventive care reminders to display for this patient.  No results found for: TSH Lab Results  Component Value Date   WBC 4.3 08/16/2019   HGB 13.7 08/16/2019   HCT 40.7 08/16/2019   MCV 95.1 08/16/2019   PLT 235 08/16/2019   Lab Results  Component Value Date   NA 139  01/22/2021   K 4.9 01/22/2021   CO2 26 01/22/2021   GLUCOSE 79 01/22/2021   BUN 14 01/22/2021   CREATININE 0.98 01/22/2021   BILITOT 0.5 01/22/2021   ALKPHOS 65 07/18/2017   AST 25 01/22/2021   ALT 20 01/22/2021   PROT 7.6 01/22/2021   ALBUMIN 4.8 07/18/2017   CALCIUM 9.7 01/22/2021   EGFR 88 01/22/2021   Lab Results  Component Value Date   CHOL 248 (H) 01/22/2021   Lab Results  Component Value Date   HDL 69 01/22/2021   Lab Results  Component Value Date   LDLCALC 164 (H) 01/22/2021   Lab Results  Component Value Date   TRIG 59 01/22/2021   Lab Results  Component Value Date   CHOLHDL 3.6 01/22/2021   Lab Results  Component Value Date   HGBA1C 5.5 06/23/2016      Assessment & Plan:   Problem List Items Addressed This Visit       Other   Hyperlipidemia - Primary (Chronic)  Lipid panel drawn today.    Other Visit Diagnoses     Immunization due      2nd dose of Shingrix due on 04/01/2021. Immunization dose ordered for clinic.      No orders of the defined types were placed in this encounter.   Follow-up:   Pt advised to review My Chart for results. Follow up with PCP for any adjustments needed to medication according to labs.   Will call pt. For 2nd dose of shingrix appointment once obtained in clinic.   RTC as needed.    Mark Chalet NP

## 2021-05-14 NOTE — Addendum Note (Signed)
Addended by: Drucilla Chalet on: 05/14/2021 10:14 AM   Modules accepted: Orders

## 2021-05-16 ENCOUNTER — Other Ambulatory Visit: Payer: Self-pay | Admitting: Nurse Practitioner

## 2021-05-16 ENCOUNTER — Telehealth: Payer: Self-pay | Admitting: Nurse Practitioner

## 2021-05-16 NOTE — Progress Notes (Signed)
Discussed lipid panel results with patient over the phone. Congratulated him on the progress of his results. Continued education on a well balanced diet and increased exercise/activity level for continued improvement. Advised to follow up with PCP about results and continue medication regimen. Pt. Agreeable with no concerns at the moment. RTC in 3 months for redraw of lipid panel.   Pt. Is returning to clinic next Thursday afternoon for his 2nd dose of the Shingrix vaccine.   Lipid Panel      Component Value Date/Time   CHOL 188 05/14/2021 1012   CHOL 230 (H) 07/18/2017 1003   TRIG 55 05/14/2021 1012   HDL 66 05/14/2021 1012   HDL 68 07/18/2017 1003   CHOLHDL 2.8 05/14/2021 1012   VLDL 13 07/14/2013 0704   LDLCALC 108 (H) 05/14/2021 1012   LABVLDL 9 07/18/2017 [place], FNP-C

## 2021-05-16 NOTE — Telephone Encounter (Signed)
Discussed lipid panel results with patient over the phone. Congratulated him on the progress of his results. Continued education on a well balanced diet and increased exercise/activity level for continued improvement. Advised to follow up with PCP about results and continue medication regimen. Pt. Agreeable with no concerns at the moment. RTC in 3 months for redraw of lipid panel.    Pt. Is returning to clinic next Thursday afternoon for his 2nd dose of the Shingrix vaccine.    Lipid Panel            Component Value Date/Time    CHOL 188 05/14/2021 1012    CHOL 230 (H) 07/18/2017 1003    TRIG 55 05/14/2021 1012    HDL 66 05/14/2021 1012    HDL 68 07/18/2017 1003    CHOLHDL 2.8 05/14/2021 1012    VLDL 13 07/14/2013 0704    LDLCALC 108 (H) 05/14/2021 1012    LABVLDL 9 07/18/2017 [place], FNP-C

## 2021-05-30 ENCOUNTER — Ambulatory Visit: Payer: Self-pay | Admitting: Nurse Practitioner

## 2021-05-30 ENCOUNTER — Other Ambulatory Visit: Payer: Self-pay

## 2021-05-30 ENCOUNTER — Encounter: Payer: Self-pay | Admitting: Nurse Practitioner

## 2021-05-30 DIAGNOSIS — Z23 Encounter for immunization: Secondary | ICD-10-CM

## 2021-05-30 NOTE — Progress Notes (Signed)
Acute Office Visit  Subjective:    Patient ID: Mark Desena., male    DOB: Dec 02, 1960, 61 y.o.   MRN: [id number]  Chief Complaint  Patient presents with   Immunizations    HPI Patient presents to clinic for his 2nd dose of the Shingrix vaccine. Pt. States he had flu like symptoms, as expected, with the first dose. He took advil prior to this appointment to try and help with adverse reactions to the vaccine.  He tolerated the first dose well with no severe allergic reactions.   Past Medical History:  Diagnosis Date   Cancer ([place]) 15-20 years ago   squamous cell carcinoma off face   GERD (gastroesophageal reflux disease)     Past Surgical History:  Procedure Laterality Date   BACK SURGERY     COLONOSCOPY  03/19/2011   Procedure: COLONOSCOPY;  Surgeon: Rogene Houston, MD;  [facility];  Service: Endoscopy;  Laterality: N/A;   SHOULDER SURGERY Left    skin cancer removed from face     UMBILICAL HERNIA REPAIR N/A 09/15/2014   Procedure: HERNIA REPAIR UMBILICAL ADULT;  Surgeon: III Dia Crawford, MD;  Location: [facility];  Service: General;  Laterality: N/A;   VASECTOMY      Family History  Problem Relation Age of Onset   Heart disease Father    Colon cancer Neg Hx     Social History   Socioeconomic History   Marital status: Married    Spouse name: Not on file   Number of children: Not on file   Years of education: Not on file   Highest education level: Not on file  Occupational History   Not on file  Tobacco Use   Smoking status: Former    Packs/day: 1.00    Years: 15.00    Pack years: 15.00    Types: Cigarettes    Quit date: 09/04/1996    Years since quitting: 24.7   Smokeless tobacco: Never  Substance and Sexual Activity   Alcohol use: Yes    Comment: Rarely   Drug use: No   Sexual activity: Yes  Other Topics Concern   Not on file  Social History Narrative   ** Merged History Encounter **       Social Determinants of Health    Financial Resource Strain: Not on file  Food Insecurity: Not on file  Transportation Needs: Not on file  Physical Activity: Not on file  Stress: Not on file  Social Connections: Not on file  Intimate Partner Violence: Not on file    Outpatient Medications Prior to Visit  Medication Sig Dispense Refill   cetirizine-pseudoephedrine (ZYRTEC-D) 5-120 MG tablet Take 1 tablet by mouth daily. (Patient not taking: Reported on 02/13/2021) 30 tablet 0   Multiple Vitamins-Minerals (MULTIVITAMINS THER. W/MINERALS) TABS Take 1 tablet by mouth daily.     OVER THE COUNTER MEDICATION Areds eye vitamin     rosuvastatin (CRESTOR) 20 MG tablet Take 1 tablet (20 mg total) by mouth daily. 90 tablet 1   No facility-administered medications prior to visit.    No Known Allergies  Review of Systems  Constitutional:  Negative for fever.  HENT:  Negative for congestion.   Gastrointestinal:  Negative for nausea and vomiting.  Allergic/Immunologic: Negative for immunocompromised state.      Objective:    Health Maintenance Due  Topic Date Due   INFLUENZA VACCINE  12/10/2020   COLONOSCOPY (Pts 45-77yrs Insurance coverage will need to be  confirmed)  03/18/2021   Zoster Vaccines- Shingrix (2 of 2) 03/26/2021    There are no preventive care reminders to display for this patient.     Assessment & Plan:   Problem List Items Addressed This Visit   None Visit Diagnoses     Immunization due    -  Primary  Resolved.  0.5 mL of Shingrix vaccine administered in the Right deltoid in clinic today.  Pt. Tolerated well.  RTC as needed.   No orders of the defined types were placed in this encounter.    Drucilla Chalet, NP

## 2021-06-06 ENCOUNTER — Encounter: Payer: Self-pay | Admitting: Nurse Practitioner

## 2021-06-06 ENCOUNTER — Ambulatory Visit (INDEPENDENT_AMBULATORY_CARE_PROVIDER_SITE_OTHER): Payer: PRIVATE HEALTH INSURANCE | Admitting: Nurse Practitioner

## 2021-06-06 ENCOUNTER — Other Ambulatory Visit: Payer: Self-pay

## 2021-06-06 VITALS — BP 138/98 | HR 88 | Temp 98.6°F | Ht 68.0 in | Wt 176.0 lb

## 2021-06-06 DIAGNOSIS — I10 Essential (primary) hypertension: Secondary | ICD-10-CM

## 2021-06-06 MED ORDER — VALSARTAN 40 MG PO TABS: 40.0000 mg | ORAL_TABLET | Freq: Every day | ORAL | 0 refills | Status: DC

## 2021-06-06 NOTE — Progress Notes (Signed)
° °  Subjective:    Patient ID: Mark [name]., male    DOB: October 19, 1960, 61 y.o.   MRN: [id number]  HPI  Patient here for elevated blood pressure readings at home and at work.  Patient states that blood pressure readings at home range from 139/92 to 146/102.  Patient states that blood pressure readings at work at higher and have been as high as 160/100.  Patient admits to a lot of stress and anxiety surrounding work.  Patient also admits that when his blood pressure is high he experiences headaches, lightheadedness, and he can feel his heart pounding in his ears.  Patient denies any chest pain, shortness of breath, difficulty breathing, palpitations, leg swelling.  Patient admits family history of arterial Vascular disease  Review of Systems  Neurological:  Positive for light-headedness and headaches.  All other systems reviewed and are negative.     Objective:   Physical Exam Constitutional:      General: He is not in acute distress.    Appearance: Normal appearance. He is normal weight. He is not ill-appearing or toxic-appearing.  HENT:     Head: Normocephalic.  Cardiovascular:     Rate and Rhythm: Normal rate and regular rhythm.     Pulses: Normal pulses.     Heart sounds: Normal heart sounds. No murmur heard. Pulmonary:     Effort: Pulmonary effort is normal. No respiratory distress.     Breath sounds: Normal breath sounds. No wheezing.  Musculoskeletal:        General: Normal range of motion.     Cervical back: Normal range of motion and neck supple.  Skin:    General: Skin is warm.     Capillary Refill: Capillary refill takes less than 2 seconds.  Neurological:     General: No focal deficit present.     Mental Status: He is alert and oriented to person, place, and time.  Psychiatric:        Mood and Affect: Mood normal.        Behavior: Behavior normal.          Assessment & Plan:   1. Primary hypertension -Blood pressure today is 138/98.  Blood pressure goal  140/90. - Due to patient reporting blood pressures greater than goal BP will start medication. - valsartan (DIOVAN) 40 MG tablet; Take 1 tablet (40 mg total) by mouth daily.  Dispense: 60 tablet; Refill: 0 - CMP14+EGFR -Discussed signs and symptoms of hypotension.  Patient to contact clinic if he experiences lightheadedness or dizziness when standing up - Return to clinic in 4 weeks for blood pressure check and lab draw.

## 2021-06-06 NOTE — Patient Instructions (Signed)
Return to clinic in 4 weeks and remember to get your lab work prior to return to your visit.

## 2021-07-02 ENCOUNTER — Other Ambulatory Visit: Payer: Self-pay

## 2021-07-02 ENCOUNTER — Other Ambulatory Visit: Payer: Self-pay | Admitting: Nurse Practitioner

## 2021-07-02 DIAGNOSIS — I10 Essential (primary) hypertension: Secondary | ICD-10-CM

## 2021-07-02 DIAGNOSIS — E7800 Pure hypercholesterolemia, unspecified: Secondary | ICD-10-CM

## 2021-07-02 LAB — UNLABELED: Test Ordered On Req: 1023176006399

## 2021-07-02 NOTE — Progress Notes (Signed)
Acute Office Visit  Subjective:    Patient ID: Mark Barro., male    DOB: [birth year], 61 y.o.   MRN: [id number]  No chief complaint on file.   HPI Patient is in today for lab draws prior to visiting his PCP. He has an appointment with Dr. Wolfgang Phoenix on 07/12/2021. He has fasted the night prior to appointment today. He is requesting his lab values be sent directly to Dr. Wolfgang Phoenix.   He denies chest pain, new muscle aches, or headaches today.   Past Medical History:  Diagnosis Date   Cancer ([place]) 15-20 years ago   squamous cell carcinoma off face   GERD (gastroesophageal reflux disease)     Past Surgical History:  Procedure Laterality Date   BACK SURGERY     COLONOSCOPY  03/19/2011   Procedure: COLONOSCOPY;  Surgeon: Rogene Houston, MD;  [facility];  Service: Endoscopy;  Laterality: N/A;   SHOULDER SURGERY Left    skin cancer removed from face     UMBILICAL HERNIA REPAIR N/A 09/15/2014   Procedure: HERNIA REPAIR UMBILICAL ADULT;  Surgeon: III Dia Crawford, MD;  Location: [facility];  Service: General;  Laterality: N/A;   VASECTOMY      Family History  Problem Relation Age of Onset   Heart disease Father    Colon cancer Neg Hx     Social History   Socioeconomic History   Marital status: Married    Spouse name: Not on file   Number of children: Not on file   Years of education: Not on file   Highest education level: Not on file  Occupational History   Not on file  Tobacco Use   Smoking status: Former    Packs/day: 1.00    Years: 15.00    Pack years: 15.00    Types: Cigarettes    Quit date: 09/04/1996    Years since quitting: 24.8   Smokeless tobacco: Never  Substance and Sexual Activity   Alcohol use: Yes    Comment: Rarely   Drug use: No   Sexual activity: Yes  Other Topics Concern   Not on file  Social History Narrative   ** Merged History Encounter **       Social Determinants of Health   Financial Resource Strain: Not on file  Food  Insecurity: Not on file  Transportation Needs: Not on file  Physical Activity: Not on file  Stress: Not on file  Social Connections: Not on file  Intimate Partner Violence: Not on file    Outpatient Medications Prior to Visit  Medication Sig Dispense Refill   cetirizine-pseudoephedrine (ZYRTEC-D) 5-120 MG tablet Take 1 tablet by mouth daily. (Patient not taking: Reported on 02/13/2021) 30 tablet 0   Multiple Vitamins-Minerals (MULTIVITAMINS THER. W/MINERALS) TABS Take 1 tablet by mouth daily.     OVER THE COUNTER MEDICATION Areds eye vitamin     rosuvastatin (CRESTOR) 20 MG tablet Take 1 tablet (20 mg total) by mouth daily. 90 tablet 1   valsartan (DIOVAN) 40 MG tablet Take 1 tablet (40 mg total) by mouth daily. 60 tablet 0   No facility-administered medications prior to visit.    No Known Allergies  Review of Systems    See HPI  Objective:    Physical Exam  There were no vitals taken for this visit. Wt Readings from Last 3 Encounters:  06/06/21 176 lb (79.8 kg)  02/13/21 175 lb (79.4 kg)  08/28/20 177 lb (80.3 kg)  Health Maintenance Due  Topic Date Due   COLONOSCOPY (Pts 45-77yr Insurance coverage will need to be confirmed)  03/18/2021    There are no preventive care reminders to display for this patient.   No results found for: TSH Lab Results  Component Value Date   WBC 4.3 08/16/2019   HGB 13.7 08/16/2019   HCT 40.7 08/16/2019   MCV 95.1 08/16/2019   PLT 235 08/16/2019   Lab Results  Component Value Date   NA 139 01/22/2021   K 4.9 01/22/2021   CO2 26 01/22/2021   GLUCOSE 79 01/22/2021   BUN 14 01/22/2021   CREATININE 0.98 01/22/2021   BILITOT 0.5 01/22/2021   ALKPHOS 65 07/18/2017   AST 25 01/22/2021   ALT 20 01/22/2021   PROT 7.6 01/22/2021   ALBUMIN 4.8 07/18/2017   CALCIUM 9.7 01/22/2021   EGFR 88 01/22/2021   Lab Results  Component Value Date   CHOL 188 05/14/2021   Lab Results  Component Value Date   HDL 66 05/14/2021   Lab  Results  Component Value Date   LDLCALC 108 (H) 05/14/2021   Lab Results  Component Value Date   TRIG 55 05/14/2021   Lab Results  Component Value Date   CHOLHDL 2.8 05/14/2021   Lab Results  Component Value Date   HGBA1C 5.5 06/23/2016       Assessment & Plan:   Problem List Items Addressed This Visit       Cardiovascular and Mediastinum   Primary hypertension - Primary  Labs drawn: CMP with GFR     Other   Hyperlipidemia (Chronic)  Stable, ongoing.  Labs drawn: CMP with GFR, Lipid panel    Other labs drawn include CBC w/ Diff to go over with his PCP at his next appointment.  Will follow up with results.  RTC as needed.   No orders of the defined types were placed in this encounter.    MDrucilla Chalet NP

## 2021-07-04 ENCOUNTER — Ambulatory Visit: Payer: Self-pay | Admitting: Nurse Practitioner

## 2021-07-04 LAB — COMPLETE METABOLIC PANEL WITH GFR
AG Ratio: 1.3 (calc) (ref 1.0–2.5)
ALT: 27 U/L (ref 9–46)
AST: 25 U/L (ref 10–35)
Albumin: 4.3 g/dL (ref 3.6–5.1)
Alkaline phosphatase (APISO): 64 U/L (ref 35–144)
BUN: 16 mg/dL (ref 7–25)
CO2: 23 mmol/L (ref 20–32)
Calcium: 9.4 mg/dL (ref 8.6–10.3)
Chloride: 105 mmol/L (ref 98–110)
Creat: 0.96 mg/dL (ref 0.70–1.35)
Globulin: 3.2 g/dL (calc) (ref 1.9–3.7)
Glucose, Bld: 72 mg/dL (ref 65–99)
Potassium: 4.5 mmol/L (ref 3.5–5.3)
Sodium: 138 mmol/L (ref 135–146)
Total Bilirubin: 0.4 mg/dL (ref 0.2–1.2)
Total Protein: 7.5 g/dL (ref 6.1–8.1)
eGFR: 90 mL/min/{1.73_m2} (ref >=60)

## 2021-07-04 LAB — CBC WITH DIFFERENTIAL/PLATELET
Absolute Monocytes: 619 cells/uL (ref 200–950)
Basophils Absolute: 52 cells/uL (ref 0–200)
Basophils Relative: 1 %
Eosinophils Absolute: 78 cells/uL (ref 15–500)
Eosinophils Relative: 1.5 %
HCT: 41.4 % (ref 38.5–50.0)
Hemoglobin: 13.8 g/dL (ref 13.2–17.1)
Lymphs Abs: 1794 cells/uL (ref 850–3900)
MCH: 31.6 pg (ref 27.0–33.0)
MCHC: 33.3 g/dL (ref 32.0–36.0)
MCV: 94.7 fL (ref 80.0–100.0)
MPV: 10.3 fL (ref 7.5–12.5)
Monocytes Relative: 11.9 %
Neutro Abs: 2657 cells/uL (ref 1500–7800)
Neutrophils Relative %: 51.1 %
Platelets: 229 10*3/uL (ref 140–400)
RBC: 4.37 10*6/uL (ref 4.20–5.80)
RDW: 12.9 % (ref 11.0–15.0)
Total Lymphocyte: 34.5 %
WBC: 5.2 10*3/uL (ref 3.8–10.8)

## 2021-07-04 LAB — PAT ID TIQ DOC

## 2021-07-04 LAB — LIPID PANEL
Cholesterol: 168 mg/dL (ref <200)
HDL: 56 mg/dL (ref >=40)
LDL Cholesterol (Calc): 93 mg/dL (calc)
Non-HDL Cholesterol (Calc): 112 mg/dL (calc) (ref <130)
Total CHOL/HDL Ratio: 3 (calc) (ref <5.0)
Triglycerides: 101 mg/dL (ref <150)

## 2021-07-11 ENCOUNTER — Ambulatory Visit: Payer: Self-pay | Admitting: Nurse Practitioner

## 2021-07-11 ENCOUNTER — Other Ambulatory Visit: Payer: Self-pay

## 2021-07-11 ENCOUNTER — Encounter: Payer: Self-pay | Admitting: Nurse Practitioner

## 2021-07-11 VITALS — BP 134/70 | HR 74 | Temp 98.4°F | Wt 180.8 lb

## 2021-07-11 DIAGNOSIS — K219 Gastro-esophageal reflux disease without esophagitis: Secondary | ICD-10-CM

## 2021-07-11 DIAGNOSIS — I10 Essential (primary) hypertension: Secondary | ICD-10-CM

## 2021-07-11 MED ORDER — FAMOTIDINE 20 MG PO TABS
20.0000 mg | ORAL_TABLET | Freq: Two times a day (BID) | ORAL | 3 refills | Status: DC
Start: 2021-07-11 — End: 2021-12-02

## 2021-07-11 NOTE — Progress Notes (Signed)
? ?Subjective:  ? ? Patient ID: Mark [name]., male    DOB: May 25, 1960, 61 y.o.   MRN: [id number] ? ?HPI ? ?Pt here to follow up on HTN. Pt states he is feeling better.  Patient was started on valsartan 40 mg during last visit.  Patient states that he has been taking valsartan as prescribed without difficulty.  Patient does admit to taking home blood pressures with cannot remember what the readings were.  However he believes that they are doing well.  Patient also admits to trying to eat better and exercise.  Denies chest pain, palpitations, shortness of breath, difficulty breathing, changes to vision, new onset headaches. ? ?Patient also presents to clinic today with concerns for acid reflux.  Patient states whenever he eats anything spicy he notices nighttime heartburn and acid reflux.  Patient states that he tried Nexium which was helpful.  Patient has also tried Tums which she states did not help.  Patient denies any vomiting, nausea, abdominal pain.   ? ?Review of Systems  ?Gastrointestinal:   ?     Acid reflux  ?All other systems reviewed and are negative. ? ?   ?Objective:  ? Physical Exam ?Constitutional:   ?   General: He is not in acute distress. ?   Appearance: Normal appearance. He is normal weight. He is not ill-appearing or toxic-appearing.  ?HENT:  ?   Head: Normocephalic.  ?Cardiovascular:  ?   Rate and Rhythm: Normal rate and regular rhythm.  ?   Pulses: Normal pulses.  ?   Heart sounds: Normal heart sounds. No murmur heard. ?Pulmonary:  ?   Effort: Pulmonary effort is normal. No respiratory distress.  ?   Breath sounds: Normal breath sounds. No wheezing.  ?Abdominal:  ?   General: Abdomen is flat.  ?Musculoskeletal:     ?   General: Normal range of motion.  ?Skin: ?   General: Skin is warm.  ?   Capillary Refill: Capillary refill takes less than 2 seconds.  ?Neurological:  ?   General: No focal deficit present.  ?   Mental Status: He is alert and oriented to person, place, and time.   ?Psychiatric:     ?   Mood and Affect: Mood normal.     ?   Behavior: Behavior normal.  ? ? ?Component ?    Latest Ref Rng & Units 07/02/2021  ?Glucose ?    65 - 99 mg/dL 72  ?BUN ?    7 - 25 mg/dL 16  ?Creatinine ?    0.70 - 1.35 mg/dL 0.96  ?eGFR ?    > OR = 60 mL/min/1.31m 90  ?BUN/Creatinine Ratio ?    6 - 22 (calc) NOT APPLICABLE  ?Sodium ?    135 - 146 mmol/L 138  ?Potassium ?    3.5 - 5.3 mmol/L 4.5  ?Chloride ?    98 - 110 mmol/L 105  ?CO2 ?    20 - 32 mmol/L 23  ?Calcium ?    8.6 - 10.3 mg/dL 9.4  ?Total Protein ?    6.1 - 8.1 g/dL 7.5  ?Albumin MSPROF ?    3.6 - 5.1 g/dL 4.3  ?Globulin ?    1.9 - 3.7 g/dL (calc) 3.2  ?AG Ratio ?    1.0 - 2.5 (calc) 1.3  ?Total Bilirubin ?    0.2 - 1.2 mg/dL 0.4  ?Alkaline phosphatase (APISO) ?    35 - 144 U/L 64  ?AST ?  10 - 35 U/L 25  ?ALT ?    9 - 46 U/L 27  ? ? ? ?   ?Assessment & Plan:  ? ?1. Primary hypertension ?-Blood pressure today 134/70.  Goal 140/90 met. ?-Continue to take valsartan 40 mg daily as prescribed. ?-Continue to make better choices with eating and exercises. ?-Return in 3 months for follow-up.  If blood pressure is good at that time may go to every 62-monthcheckups. ?-Recent GFR was 90.  Patient seems to be doing well on valsartan. ? ?2. Gastroesophageal reflux disease without esophagitis ?-Patient to start on Pepcid. ?- famotidine (PEPCID) 20 MG tablet; Take 1 tablet (20 mg total) by mouth 2 (two) times daily.  Dispense: 60 tablet; Refill: 3 ?-Follow-up in 3 months. ? ?

## 2021-08-02 ENCOUNTER — Other Ambulatory Visit: Payer: Self-pay | Admitting: Nurse Practitioner

## 2021-08-02 DIAGNOSIS — I10 Essential (primary) hypertension: Secondary | ICD-10-CM

## 2021-08-14 ENCOUNTER — Ambulatory Visit: Payer: PRIVATE HEALTH INSURANCE | Admitting: Family Medicine

## 2021-09-01 ENCOUNTER — Other Ambulatory Visit: Payer: Self-pay | Admitting: Family Medicine

## 2021-09-18 ENCOUNTER — Ambulatory Visit (HOSPITAL_COMMUNITY)
Admission: RE | Admit: 2021-09-18 | Discharge: 2021-09-18 | Disposition: A | Discharge status: Home / Self Care | Payer: PRIVATE HEALTH INSURANCE | Source: Ambulatory Visit | Attending: Family Medicine | Admitting: Family Medicine

## 2021-09-18 ENCOUNTER — Telehealth: Payer: Self-pay | Admitting: Family Medicine

## 2021-09-18 ENCOUNTER — Ambulatory Visit: Payer: Self-pay | Admitting: Family Medicine

## 2021-09-18 VITALS — BP 124/72 | HR 87 | Temp 98.2°F | Ht 68.0 in | Wt 177.8 lb

## 2021-09-18 DIAGNOSIS — Z79899 Other long term (current) drug therapy: Secondary | ICD-10-CM

## 2021-09-18 DIAGNOSIS — R0789 Other chest pain: Secondary | ICD-10-CM | POA: Diagnosis present

## 2021-09-18 DIAGNOSIS — I10 Essential (primary) hypertension: Secondary | ICD-10-CM

## 2021-09-18 DIAGNOSIS — R0609 Other forms of dyspnea: Secondary | ICD-10-CM

## 2021-09-18 DIAGNOSIS — E785 Hyperlipidemia, unspecified: Secondary | ICD-10-CM

## 2021-09-18 NOTE — Progress Notes (Addendum)
? ?  Subjective:  ? ? Patient ID: Mark [name]., male    DOB: [birth year], 61 y.o.   MRN: [id number] ? ?HPI ? ?Patient here for tightness in chest and shortness of breath. Pt says it's getting worse. Pt says it has been going on longer then 3 weeks ?Significant discomfort along with shortness of breath with moderate activity present over the past 3 weeks no known triggers.  Is not a smoker.  Has a family history of heart disease and interstitial lung disease ? ?Patient states he was doing some mild activity and had significant chest tightness pressure and discomfort he is no longer having that currently and if he does regular gentle activity does not have any problems ?Review of Systems ? ?   ?Objective:  ? Physical Exam ? ?General-in no acute distress ?Eyes-no discharge ?Lungs-respiratory rate normal, CTA ?CV-no murmurs,RRR ?Extremities skin warm dry no edema ?Neuro grossly normal ?Behavior normal, alert ? ? ? ?   ?Assessment & Plan:  ?1. DOE (dyspnea on exertion) ?Significant dyspnea on exertion ?EKG does show some ST segment depressions lateral aspect ?We will proceed forward with urgent referral to cardiology ?More than likely patient will either need CT angiogram or catheterization to look for potential blockages. ?Patient is on statin but LDL not below 70 await the most updated labs ? ?- Basic Metabolic Panel (BMET) ?- Lipid Profile ?- Hepatic function panel ?- CBC with Differential ?- B Nat Peptide ?- EKG 12-Lead ?- Ambulatory referral to Cardiology ? ?2. Chest tightness ?This is with activity classic angina.  Will discuss with cardiology. ?- DG Chest 2 View ?- Basic Metabolic Panel (BMET) ?- Lipid Profile ?- Hepatic function panel ?- CBC with Differential ?- B Nat Peptide ?- EKG 12-Lead ?- Ambulatory referral to Cardiology ? ?3. Hyperlipidemia, unspecified hyperlipidemia type ?Continue Crestor get LDL below 70 ?- Basic Metabolic Panel (BMET) ?- Lipid Profile ?- Hepatic function panel ?- CBC with  Differential ?- B Nat Peptide ? ?4. Primary hypertension ?Blood pressure decent control but elevated today but upon recheck was good ?- Basic Metabolic Panel (BMET) ?- Lipid Profile ?- Hepatic function panel ?- CBC with Differential ?- B Nat Peptide ? ?5. High risk medication use ?Labs ordered ?- Basic Metabolic Panel (BMET) ?- Lipid Profile ?- Hepatic function panel ?- CBC with Differential ?- B Nat Peptide ? ?I did talk with the patient while he was at the office visit told him if he had any severe chest pain or discomfort to immediately go to emergency department.  Also told him to avoid any type of vigorous exercise or work ? ?I did speak with [facility].  They were able to get him an appointment with Dr. Phineas Inches Friday morning at 9:40 AM at the [facility] office patient was communicated with the show up at 9:10 AM to get established for that appointment ?

## 2021-09-18 NOTE — Telephone Encounter (Signed)
Dr.Scott spoke with [facility]. They have pt scheduled for Friday 09/20/21 at 9:10 for a 9:40 am appointment with Dr.Mary Branch. Pt has been contacted and is aware. Address and phone number given to patient.  ?

## 2021-09-19 LAB — CBC WITH DIFFERENTIAL/PLATELET
Basophils Absolute: 0.1 10*3/uL (ref 0.0–0.2)
Basos: 1 %
EOS (ABSOLUTE): 0.1 10*3/uL (ref 0.0–0.4)
Eos: 1 %
Hematocrit: 43.2 % (ref 37.5–51.0)
Hemoglobin: 14.8 g/dL (ref 13.0–17.7)
Immature Grans (Abs): 0 10*3/uL (ref 0.0–0.1)
Immature Granulocytes: 0 %
Lymphocytes Absolute: 1.1 10*3/uL (ref 0.7–3.1)
Lymphs: 19 %
MCH: 31.6 pg (ref 26.6–33.0)
MCHC: 34.3 g/dL (ref 31.5–35.7)
MCV: 92 fL (ref 79–97)
Monocytes Absolute: 0.8 10*3/uL (ref 0.1–0.9)
Monocytes: 15 %
Neutrophils Absolute: 3.6 10*3/uL (ref 1.4–7.0)
Neutrophils: 64 %
Platelets: 264 10*3/uL (ref 150–450)
RBC: 4.68 x10E6/uL (ref 4.14–5.80)
RDW: 12.7 % (ref 11.6–15.4)
WBC: 5.6 10*3/uL (ref 3.4–10.8)

## 2021-09-19 LAB — HEPATIC FUNCTION PANEL
ALT: 26 IU/L (ref 0–44)
AST: 31 IU/L (ref 0–40)
Albumin: 4.8 g/dL (ref 3.8–4.8)
Alkaline Phosphatase: 86 IU/L (ref 44–121)
Bilirubin Total: 0.4 mg/dL (ref 0.0–1.2)
Bilirubin, Direct: 0.12 mg/dL (ref 0.00–0.40)
Total Protein: 7.8 g/dL (ref 6.0–8.5)

## 2021-09-19 LAB — BASIC METABOLIC PANEL
BUN/Creatinine Ratio: 14 (ref 10–24)
BUN: 14 mg/dL (ref 8–27)
CO2: 22 mmol/L (ref 20–29)
Calcium: 10 mg/dL (ref 8.6–10.2)
Chloride: 102 mmol/L (ref 96–106)
Creatinine, Ser: 0.97 mg/dL (ref 0.76–1.27)
Glucose: 102 mg/dL — ABNORMAL HIGH (ref 70–99)
Potassium: 4.9 mmol/L (ref 3.5–5.2)
Sodium: 141 mmol/L (ref 134–144)
eGFR: 89 mL/min/{1.73_m2} (ref >59)

## 2021-09-19 LAB — LIPID PANEL
Chol/HDL Ratio: 2.4 ratio (ref 0.0–5.0)
Cholesterol, Total: 177 mg/dL (ref 100–199)
HDL: 75 mg/dL (ref >39)
LDL Chol Calc (NIH): 91 mg/dL (ref 0–99)
Triglycerides: 53 mg/dL (ref 0–149)
VLDL Cholesterol Cal: 11 mg/dL (ref 5–40)

## 2021-09-19 LAB — BRAIN NATRIURETIC PEPTIDE: BNP: 18.5 pg/mL (ref 0.0–100.0)

## 2021-09-19 MED ORDER — ROSUVASTATIN CALCIUM 40 MG PO TABS: 40.0000 mg | ORAL_TABLET | Freq: Every day | ORAL | 1 refills | Status: DC

## 2021-09-19 NOTE — Addendum Note (Signed)
Addended by: Dairl Ponder on: 09/19/2021 04:33 PM ? ? Modules accepted: Orders ? ?

## 2021-09-19 NOTE — Progress Notes (Signed)
?Cardiology Office Note:   ? ?Date:  09/19/2021  ? ?ID:  Mark Dell., DOB 1960-08-18, MRN [id number] ? ?PCP:  Kathyrn Drown, MD ?  ?[facility] Providers ?Cardiologist:  None    ? ?Referring MD: Kathyrn Drown, MD  ? ?No chief complaint on file. ?Chest pain ? ?History of Present Illness:   ? ?Mark [name]. is a 61 y.o. male with a hx of squamous cell carcinoma referral for shortness of breath, chest discomfort ? ? He reports chest tightness and worsening dyspnea most intense on Saturday. He was doing lawn work, moving tree branches around. He is feeling more shortness of breath with walking. This has been going on for approximately 3 weeks duration. At rest he has not chest discomfort. He has no symptoms with ordinary activities.  He reports a stress test ~ 20 years ago. His EKG shows mild LVH with repol. Cxray showed chronic interstitial lung dx. Normal renal function. Blood pressures controlled at home with occasional screening. His father had MI in his 47s.  ? ?Past Medical History:  ?Diagnosis Date  ? Cancer ([place]) 15-20 years ago  ? squamous cell carcinoma off face  ? GERD (gastroesophageal reflux disease)   ? ? ?Past Surgical History:  ?Procedure Laterality Date  ? BACK SURGERY    ? COLONOSCOPY  03/19/2011  ? Procedure: COLONOSCOPY;  Surgeon: Rogene Houston, MD;  [facility];  Service: Endoscopy;  Laterality: N/A;  ? SHOULDER SURGERY Left   ? skin cancer removed from face    ? UMBILICAL HERNIA REPAIR N/A 09/15/2014  ? Procedure: HERNIA REPAIR UMBILICAL ADULT;  Surgeon: III Dia Crawford, MD;  Location: [facility] ORS;  Service: General;  Laterality: N/A;  ? VASECTOMY    ? ? ?Current Medications: ?Current Outpatient Medications on File Prior to Visit  ?Medication Sig Dispense Refill  ? APPLE CIDER VINEGAR PO Take by mouth daily.    ? famotidine (PEPCID) 20 MG tablet Take 1 tablet (20 mg total) by mouth 2 (two) times daily. 60 tablet 3  ? Multiple Vitamins-Minerals (MULTIVITAMINS THER.  W/MINERALS) TABS Take 1 tablet by mouth daily.    ? OVER THE COUNTER MEDICATION Areds eye vitamin    ? rosuvastatin (CRESTOR) 40 MG tablet Take 1 tablet (40 mg total) by mouth daily. 90 tablet 1  ? valsartan (DIOVAN) 40 MG tablet TAKE 1 TABLET BY MOUTH EVERY DAY 30 tablet 1  ? cetirizine-pseudoephedrine (ZYRTEC-D) 5-120 MG tablet Take 1 tablet by mouth daily. (Patient not taking: Reported on 09/18/2021) 30 tablet 0  ? ?No current facility-administered medications on file prior to visit.  ? ? ?Allergies:   Patient has no known allergies.  ? ?Social History  ? ?Socioeconomic History  ? Marital status: Married  ?  Spouse name: Not on file  ? Number of children: Not on file  ? Years of education: Not on file  ? Highest education level: Not on file  ?Occupational History  ? Not on file  ?Tobacco Use  ? Smoking status: Former  ?  Packs/day: 1.00  ?  Years: 15.00  ?  Pack years: 15.00  ?  Types: Cigarettes  ?  Quit date: 09/04/1996  ?  Years since quitting: 25.0  ? Smokeless tobacco: Never  ?Substance and Sexual Activity  ? Alcohol use: Yes  ?  Comment: Rarely  ? Drug use: No  ? Sexual activity: Yes  ?Other Topics Concern  ? Not on file  ?Social History  Narrative  ? ** Merged History Encounter **  ?    ? ?Social Determinants of Health  ? ?Financial Resource Strain: Not on file  ?Food Insecurity: Not on file  ?Transportation Needs: Not on file  ?Physical Activity: Not on file  ?Stress: Not on file  ?Social Connections: Not on file  ?  ? ?Family History: ?The patient's family history includes Heart disease in his father. There is no history of Colon cancer. ? ?ROS:   ?Please see the history of present illness.    ? All other systems reviewed and are negative. ? ?EKGs/Labs/Other Studies Reviewed:   ? ?The following studies were reviewed today: ? ? ?EKG:  EKG is  ordered today.  The ekg ordered today demonstrates  ? ?09/20/2021-NSR, LVH ? ?Recent Labs: ?09/18/2021: ALT 26; BNP 18.5; BUN 14; Creatinine, Ser 0.97; Hemoglobin 14.8;  Platelets 264; Potassium 4.9; Sodium 141  ? ?Recent Lipid Panel ?   ?Component Value Date/Time  ? CHOL 177 09/18/2021 1029  ? TRIG 53 09/18/2021 1029  ? HDL 75 09/18/2021 1029  ? CHOLHDL 2.4 09/18/2021 1029  ? CHOLHDL 3.0 07/02/2021 0830  ? VLDL 13 07/14/2013 0704  ? [place] 91 09/18/2021 1029  ? [place] 93 07/02/2021 0830  ? ? ? ?Risk Assessment/Calculations:   ?  ? ?    ? ?Physical Exam:   ? ?VS:   ? ?Vitals:  ? 09/20/21 0924  ?BP: 140/90  ?Pulse: 74  ?SpO2: 95%  ? ? ? ?Wt Readings from Last 3 Encounters:  ?09/18/21 177 lb 12.8 oz (80.6 kg)  ?07/11/21 180 lb 12.8 oz (82 kg)  ?06/06/21 176 lb (79.8 kg)  ?  ? ?GEN:  Well nourished, well developed in no acute distress ?HEENT: Normal ?NECK: No JVD; No carotid bruits ?LYMPHATICS: No lymphadenopathy ?CARDIAC: RRR, no murmurs, rubs, gallops ?RESPIRATORY:  Clear to auscultation without rales, wheezing or rhonchi  ?ABDOMEN: Soft, non-tender, non-distended ?MUSCULOSKELETAL:  No edema; No deformity  ?SKIN: Warm and dry ?NEUROLOGIC:  Alert and oriented x 3 ?PSYCHIATRIC:  Normal affect  ? ?ASSESSMENT:   ? ?Angina: CCS II angina. With CVD risk (age, family hx, htn), will proceed with exercise nuclear stress testing. If has ischemic disease, will schedule LHC and arrange follow up. If has ischemic dx agree LDL goal < 70 mg/dL. Goal blood pressure goal < 130/80 mmhg. We discussed that if his symptoms progress significantly for him to seek urgent medical attention. ? ?PLAN:   ? ?In order of problems listed above: ? ?Exercise Nuclear Study ?Follow up as needed pending the test ? ?   ? ?Shared Decision Making/Informed Consent ?The risks [chest pain, shortness of breath, cardiac arrhythmias, dizziness, blood pressure fluctuations, myocardial infarction, stroke/transient ischemic attack, nausea, vomiting, allergic reaction, radiation exposure, metallic taste sensation and life-threatening complications (estimated to be 1 in 10,000)], benefits (risk stratification, diagnosing coronary  artery disease, treatment guidance) and alternatives of a nuclear stress test were discussed in detail with Mark [name] and he agrees to proceed.  ? ? ?Medication Adjustments/Labs and Tests Ordered: ?Current medicines are reviewed at length with the patient today.  Concerns regarding medicines are outlined above.  ?No orders of the defined types were placed in this encounter. ? ?No orders of the defined types were placed in this encounter. ? ? ?There are no Patient Instructions on file for this visit.  ? ?Signed, ?Janina Mayo, MD  ?09/19/2021 1:37 PM    ?[place] ?

## 2021-09-20 ENCOUNTER — Telehealth (HOSPITAL_COMMUNITY): Payer: Self-pay | Admitting: *Deleted

## 2021-09-20 ENCOUNTER — Encounter: Payer: Self-pay | Admitting: Internal Medicine

## 2021-09-20 ENCOUNTER — Ambulatory Visit (INDEPENDENT_AMBULATORY_CARE_PROVIDER_SITE_OTHER): Payer: PRIVATE HEALTH INSURANCE | Admitting: Internal Medicine

## 2021-09-20 VITALS — BP 140/90 | HR 74 | Ht 68.0 in | Wt 175.8 lb

## 2021-09-20 DIAGNOSIS — R079 Chest pain, unspecified: Secondary | ICD-10-CM

## 2021-09-20 DIAGNOSIS — R0609 Other forms of dyspnea: Secondary | ICD-10-CM

## 2021-09-20 NOTE — Patient Instructions (Signed)
Medication Instructions:  ?No Changes In Medications at this time.  ?*If you need a refill on your cardiac medications before your next appointment, please call your pharmacy* ? ?Lab Work: ?None Ordered At This Time.  ?If you have labs (blood work) drawn today and your tests are completely normal, you will receive your results only by: ?MyChart Message (if you have MyChart) OR ?A paper copy in the mail ?If you have any lab test that is abnormal or we need to change your treatment, we will call you to review the results. ? ?Testing/Procedures: ?Your physician has requested that you have en exercise stress myoview. For further information please visit [email or web address]. Please follow instruction sheet, as given. This will take place at [address] ? ? ?The test will take approximately 3 to 4 hours to complete; you may bring reading material.  If someone comes with you to your appointment, they will need to remain in the main lobby due to limited space in the testing area.  ?  ?How to prepare for your Myocardial Perfusion Test: ?Do not eat or drink 3 hours prior to your test, except you may have water. ?Do not consume products containing caffeine (regular or decaffeinated) 12 hours prior to your test. (ex: coffee, chocolate, sodas, tea). ?Do wear comfortable clothes (no dresses or overalls) and walking shoes, tennis shoes preferred (No heels or open toe shoes are allowed). ?Do NOT wear cologne, perfume, aftershave, or lotions (deodorant is allowed). ?If you use an inhaler, use it the AM of your test and bring it with you.  ?If you use a nebulizer, use it the AM of your test.  ?If these instructions are not followed, your test will have to be rescheduled. ? ?Follow-Up: ?At [facility], you and your health needs are our priority.  As part of our continuing mission to provide you with exceptional heart care, we have created designated Provider Care Teams.  These Care Teams include your primary  Cardiologist (physician) and Advanced Practice Providers (APPs -  Physician Assistants and Nurse Practitioners) who all work together to provide you with the care you need, when you need it. ? ?Your next appointment:   ?AS NEEDED- BASED ON STRESS TEST  ? ?The format for your next appointment:   ?In Person ? ?Provider:   ?Janina Mayo, MD   ? ? ? ? ? ?  ?

## 2021-09-20 NOTE — Telephone Encounter (Signed)
Patient given detailed instructions per Myocardial Perfusion Study Information Sheet for the test on 09/27/2021 at 10:00. Patient notified to arrive 15 minutes early and that it is imperative to arrive on time for appointment to keep from having the test rescheduled. ? If you need to cancel or reschedule your appointment, please call the office within 24 hours of your appointment. . Patient verbalized understanding.Mark [name] ? ? ?

## 2021-09-27 ENCOUNTER — Ambulatory Visit (HOSPITAL_COMMUNITY): Payer: PRIVATE HEALTH INSURANCE | Attending: Cardiovascular Disease

## 2021-09-27 DIAGNOSIS — R079 Chest pain, unspecified: Secondary | ICD-10-CM | POA: Diagnosis present

## 2021-09-27 DIAGNOSIS — R0609 Other forms of dyspnea: Secondary | ICD-10-CM | POA: Diagnosis present

## 2021-09-27 LAB — MYOCARDIAL PERFUSION IMAGING
Angina Index: 0
Estimated workload: 9.7
Exercise duration (min): 7 min
Exercise duration (sec): 45 s
LV dias vol: 84 mL (ref 62–150)
LV sys vol: 40 mL
MPHR: 159 {beats}/min
Nuc Stress EF: 52 %
Peak HR: 123 {beats}/min
Percent HR: 77 %
Rest HR: 75 {beats}/min
Rest Nuclear Isotope Dose: 10.1 mCi
SDS: 6
SRS: 0
SSS: 6
Stress Nuclear Isotope Dose: 32.3 mCi
TID: 1.12

## 2021-09-27 MED ORDER — TECHNETIUM TC 99M TETROFOSMIN IV KIT
32.3000 | PACK | Freq: Once | INTRAVENOUS | Status: AC | PRN
  Administered 2021-09-27: 32.3 via INTRAVENOUS

## 2021-09-27 MED ORDER — TECHNETIUM TC 99M TETROFOSMIN IV KIT
10.1000 | PACK | Freq: Once | INTRAVENOUS | Status: AC | PRN
  Administered 2021-09-27: 10.1 via INTRAVENOUS

## 2021-10-01 ENCOUNTER — Telehealth: Payer: Self-pay | Admitting: Family Medicine

## 2021-10-01 DIAGNOSIS — R0609 Other forms of dyspnea: Secondary | ICD-10-CM

## 2021-10-01 NOTE — Telephone Encounter (Signed)
Blood work, referral to pulmonology and pulmonary function test ordered in [place]. Patient notified and scheduled office visit Friday 10/04/21 at 8:40am with Dr Nicki Reaper

## 2021-10-01 NOTE — Addendum Note (Signed)
Addended by: Dairl Ponder on: 10/01/2021 04:41 PM   Modules accepted: Orders

## 2021-10-01 NOTE — Telephone Encounter (Signed)
1 I would recommend very important for him to keep his follow-up visit.  Please move this follow-up visit up to later this week or early next week #2 recommend pulmonary function testing to be set up #3 recommend pulmonary consult #4 when the patient does do follow-up we will discuss further testing #5 I recommend looking at met 7, BNP, D-dimer due to shortness of breath  Please make sure patient is not getting worse if he is having severe time or struggling may need to see him ASAP thank you

## 2021-10-01 NOTE — Telephone Encounter (Signed)
Pt calling in to see what his next steps are due to continued issues with getting short winded. Pt did see cardiology and pt states they didn't really find anything wrong with his heart. Pt had a stress test and other testing. Pt is wondering what does he do now. Please advise. Thank you.

## 2021-10-03 ENCOUNTER — Encounter: Payer: Self-pay | Admitting: Internal Medicine

## 2021-10-03 ENCOUNTER — Ambulatory Visit (INDEPENDENT_AMBULATORY_CARE_PROVIDER_SITE_OTHER): Payer: PRIVATE HEALTH INSURANCE | Admitting: Internal Medicine

## 2021-10-03 VITALS — BP 126/72 | HR 100 | Temp 98.2°F | Ht 68.0 in | Wt 176.6 lb

## 2021-10-03 DIAGNOSIS — R0602 Shortness of breath: Secondary | ICD-10-CM | POA: Diagnosis not present

## 2021-10-03 DIAGNOSIS — R0609 Other forms of dyspnea: Secondary | ICD-10-CM

## 2021-10-03 LAB — PULMONARY FUNCTION TEST
DL/VA % pred: 90 %
DL/VA: 3.86 ml/min/mmHg/L
DLCO cor % pred: 85 %
DLCO cor: 21.27 ml/min/mmHg
DLCO unc % pred: 85 %
DLCO unc: 21.27 ml/min/mmHg
FEF 25-75 Post: 5.41 L/sec
FEF 25-75 Pre: 5.18 L/sec
FEF2575-%Change-Post: 4 %
FEF2575-%Pred-Post: 204 %
FEF2575-%Pred-Pre: 195 %
FEV1-%Change-Post: 1 %
FEV1-%Pred-Post: 105 %
FEV1-%Pred-Pre: 104 %
FEV1-Post: 3.39 L
FEV1-Pre: 3.34 L
FEV1FVC-%Change-Post: 4 %
FEV1FVC-%Pred-Pre: 113 %
FEV6-%Change-Post: -2 %
FEV6-%Pred-Post: 94 %
FEV6-%Pred-Pre: 96 %
FEV6-Post: 3.8 L
FEV6-Pre: 3.91 L
FEV6FVC-%Pred-Post: 105 %
FEV6FVC-%Pred-Pre: 105 %
FVC-%Change-Post: -2 %
FVC-%Pred-Post: 89 %
FVC-%Pred-Pre: 92 %
FVC-Post: 3.8 L
FVC-Pre: 3.91 L
Post FEV1/FVC ratio: 89 %
Post FEV6/FVC ratio: 100 %
Pre FEV1/FVC ratio: 85 %
Pre FEV6/FVC Ratio: 100 %
RV % pred: 85 %
RV: 1.8 L
TLC % pred: 89 %
TLC: 5.74 L

## 2021-10-03 LAB — BASIC METABOLIC PANEL
BUN/Creatinine Ratio: 17 (ref 10–24)
BUN: 17 mg/dL (ref 8–27)
CO2: 22 mmol/L (ref 20–29)
Calcium: 9.7 mg/dL (ref 8.6–10.2)
Chloride: 103 mmol/L (ref 96–106)
Creatinine, Ser: 1 mg/dL (ref 0.76–1.27)
Glucose: 105 mg/dL — ABNORMAL HIGH (ref 70–99)
Potassium: 4.4 mmol/L (ref 3.5–5.2)
Sodium: 140 mmol/L (ref 134–144)
eGFR: 86 mL/min/{1.73_m2} (ref >59)

## 2021-10-03 LAB — BRAIN NATRIURETIC PEPTIDE: BNP: 18.2 pg/mL (ref 0.0–100.0)

## 2021-10-03 LAB — D-DIMER, QUANTITATIVE: D-DIMER: 0.21 mg/L FEU (ref 0.00–0.49)

## 2021-10-03 MED ORDER — ALBUTEROL SULFATE HFA 108 (90 BASE) MCG/ACT IN AERS: 2.0000 | INHALATION_SPRAY | RESPIRATORY_TRACT | 4 refills | Status: AC | PRN

## 2021-10-03 NOTE — Patient Instructions (Addendum)
Please schedule follow up scheduled with myself in 1 months.  If my schedule is not open yet, we will contact you with a reminder closer to that time. Please call [phone] if you haven't heard from [place] a month before.   Before your next visit I would like you to have: Full set of PFTs   Take the albuterol rescue inhaler every 4 to 6 hours as needed for wheezing or shortness of breath. You can also take it 15 minutes before exercise or exertional activity. Side effects include heart racing or pounding, jitters or anxiety. If you have a history of an irregular heart rhythm, it can make this worse. Can also give some patients a hard time sleeping.  To inhale the aerosol using an inhaler, follow these steps:  Remove the protective dust cap from the end of the mouthpiece. If the dust cap was not placed on the mouthpiece, check the mouthpiece for dirt or other objects. Be sure that the canister is fully and firmly inserted in the mouthpiece. 2. If you are using the inhaler for the first time or if you have not used the inhaler in more than 14 days, you will need to prime it. You may also need to prime the inhaler if it has been dropped. Ask your pharmacist or check the manufacturer's information if this happens. To prime the inhaler, shake it well and then press down on the canister 4 times to release 4 sprays into the air, away from your face. Be careful not to get albuterol in your eyes. 3. Shake the inhaler well. 4. Breathe out as completely as possible through your mouth. 4. Hold the canister with the mouthpiece on the bottom, facing you and the canister pointing upward. Place the open end of the mouthpiece into your mouth. Close your lips tightly around the mouthpiece. 6. Breathe in slowly and deeply through the mouthpiece.At the same time, press down once on the container to spray the medication into your mouth. 7. Try to hold your breath for 10 seconds. remove the inhaler, and breathe out  slowly. 8. If you were told to use 2 puffs, wait 1 minute and then repeat steps 3-7. 9. Replace the protective cap on the inhaler. 10. Clean your inhaler regularly. Follow the manufacturer's directions carefully and ask your doctor or pharmacist if you have any questions about cleaning your inhaler.  Check the back of the inhaler to keep track of the total number of doses left on the inhaler.

## 2021-10-03 NOTE — Progress Notes (Signed)
PFT done today. 

## 2021-10-03 NOTE — Progress Notes (Signed)
Mark [name]    [id number]    1961/01/22  Primary Care Physician:Luking, Elayne Snare, MD  Referring Physician: Kathyrn Drown, [address] [place],  [place] [address] Reason for Consultation: shortness of breath Date of Consultation: 10/03/2021  Chief complaint:   Chief Complaint  Patient presents with   Consult    Pt is here for dyspnea on exertion. Pt states he started noticing it 3 weeks ago. Has went to a cardiologist about his heart. Pt states he notices at work that he can not walk long distances. He feels like he can not get enough air in his lungs. Pt states he is not on any inhalers      HPI: Mark [name]. is a 61 y.o. man who presents for new patient evaluation of dyspnea.   Symptoms were discussed at PCP visit earlier this month. He was set up to see Dr. Harl Bowie in cardiology as the symptoms were concerning for angina. He had an stress test that was negative for ischemic changes. He presents here for further evaluation of his dyspnea. His stress test showed increased ST depression and inferior lateral leads with stress.   Symptoms started 3-4 weeks ago while he was working in the yard where he had shortness of breath and chest tightness and pain. He was concerned about this since his father had several heart attacks.    Has been coughing up mucus which is yellow/green. No fevers, chills.  Never had pneumonia or bronchitis before.  No childhood respiratory disease. Denies orthopnea, le edema.   He is on his feet and walks a lot but feels like he needs to slow down at work because he is getting short winded.   Social history:  Occupation: works as Music therapist for [employer].  Exposures: lives at home with wife and son.  Smoking history: former smoker, 15 pack years.   Social History   Occupational History   Not on file  Tobacco Use   Smoking status: Former    Packs/day: 1.00    Years: 15.00    Pack years: 15.00     Types: Cigarettes    Start date: 10    Quit date: 09/04/1996    Years since quitting: 25.0   Smokeless tobacco: Never  Substance and Sexual Activity   Alcohol use: Yes    Comment: Rarely   Drug use: No   Sexual activity: Yes    Relevant family history:  Family History  Problem Relation Age of Onset   Heart disease Father    Pulmonary fibrosis Father    Colon cancer Neg Hx     Past Medical History:  Diagnosis Date   Cancer ([place]) 15-20 years ago   squamous cell carcinoma off face   GERD (gastroesophageal reflux disease)     Past Surgical History:  Procedure Laterality Date   BACK SURGERY     COLONOSCOPY  03/19/2011   Procedure: COLONOSCOPY;  Surgeon: Rogene Houston, MD;  [facility];  Service: Endoscopy;  Laterality: N/A;   SHOULDER SURGERY Left    skin cancer removed from face     UMBILICAL HERNIA REPAIR N/A 09/15/2014   Procedure: HERNIA REPAIR UMBILICAL ADULT;  Surgeon: III Dia Crawford, MD;  Location: [facility] ORS;  Service: General;  Laterality: N/A;   VASECTOMY       Physical Exam: Blood pressure 126/72, pulse 100, temperature 98.2 F (36.8 C), temperature  source Oral, height '5\' 8"'$  (1.727 m), weight 176 lb 9.6 oz (80.1 kg), SpO2 96 %. Gen:      No acute distress ENT:  no nasal polyps, mucus membranes moist Lungs:    No increased respiratory effort, symmetric chest wall excursion, clear to auscultation bilaterally, no wheezes or crackles CV:         Regular rate and rhythm; no murmurs, rubs, or gallops.  No pedal edema Abd:      + bowel sounds; soft, non-tender; no distension MSK: no acute synovitis of DIP or PIP joints, no mechanics hands.  Skin:      Warm and dry; no rashes Neuro: normal speech, no focal facial asymmetry Psych: alert and oriented x3, normal mood and affect   Data Reviewed/Medical Decision Making:  Independent interpretation of tests: Imaging:  Review of patient's chest xray images May 2023 revealed no acute process, maybe low  lung volumes. The patient's images have been independently reviewed by me.    PFTs: I have personally reviewed the patient's PFTs and      View : No data to display.          Labs:  Lab Results  Component Value Date   WBC 5.6 09/18/2021   HGB 14.8 09/18/2021   HCT 43.2 09/18/2021   MCV 92 09/18/2021   PLT 264 09/18/2021   Lab Results  Component Value Date   NA 140 10/02/2021   K 4.4 10/02/2021   CL 103 10/02/2021   CO2 22 10/02/2021     Immunization status:  Immunization History  Administered Date(s) Administered   Influenza-Unspecified 02/10/2020, 02/05/2021   Tdap 01/24/2020   Zoster Recombinat (Shingrix) 01/29/2021, 05/30/2021     I reviewed prior external note(s) from cardiology, pcp  I reviewed the result(s) of the labs and imaging as noted above.   I have ordered pfts  Discussion of management or test interpretation with another colleague.   Assessment:  Dyspnea on exertion Family history CAD 15 pack year smoking history, quit 30 years ago.   Plan/Recommendations: Will trial albuterol as needed to see if any benefit. He has a pretty mild smoking history to have copd. Symptoms are not classic for asthma, but that could be a consideration. Will obtain a full set of PFTs.    Return to Care: Return in about 4 weeks (around 10/31/2021).  Lenice Llamas, MD Pulmonary and [place]  CC: Kathyrn Drown, MD

## 2021-10-04 ENCOUNTER — Ambulatory Visit (INDEPENDENT_AMBULATORY_CARE_PROVIDER_SITE_OTHER): Payer: PRIVATE HEALTH INSURANCE | Admitting: Family Medicine

## 2021-10-04 ENCOUNTER — Encounter: Payer: Self-pay | Admitting: Family Medicine

## 2021-10-04 ENCOUNTER — Other Ambulatory Visit: Payer: Self-pay

## 2021-10-04 VITALS — BP 133/81 | HR 87 | Temp 99.9°F | Ht 68.0 in | Wt 176.0 lb

## 2021-10-04 DIAGNOSIS — R0609 Other forms of dyspnea: Secondary | ICD-10-CM

## 2021-10-04 DIAGNOSIS — I10 Essential (primary) hypertension: Secondary | ICD-10-CM

## 2021-10-04 DIAGNOSIS — E7800 Pure hypercholesterolemia, unspecified: Secondary | ICD-10-CM | POA: Diagnosis not present

## 2021-10-04 NOTE — Progress Notes (Signed)
   Subjective:    Patient ID: Mark Dell., male    DOB: 03/30/1961, 62 y.o.   MRN: [id number]  Shortness of Breath This is a recurrent problem. The current episode started 1 to 4 weeks ago. The problem occurs daily. The symptoms are aggravated by any activity. Treatments tried: albuterol inhaler prn.  Significant shortness of breath with activity He states when he is doing the treadmill he felt a lot of chest tightness discomfort felt like his chest was going to explode EKG did show some ST segment depressions but the Myoview images did not show ischemia Ejection fraction was depressed Patient did see pulmonary and they did do pulmonary function test they have not told him any results at this point they told him to try albuterol and follow-up as needed   Review of Systems  Respiratory:  Positive for shortness of breath.     Follow up from cardiology and pulmonology consult  Objective:   Physical Exam  Lungs clear respiratory rate normal heart regular blood pressure good      Assessment & Plan:  DOE Recommend echo 1. Primary hypertension Blood pressure good control continue current measures  2. Pure hypercholesterolemia Hyperlipidemia continue medication  3. DOE (dyspnea on exertion) Highly recommended for this patient to get echo Await the results If abnormal I would recommend pursuing cardiac further.  It does bother me that he had ST segment changes as well as symptomatology when he was exercising.  Also his heart rate could get could not get up to the peak heart rate before he had to stop which to me raises into question how accurate the Myoview imaging would be

## 2021-10-09 ENCOUNTER — Other Ambulatory Visit: Payer: Self-pay | Admitting: Family Medicine

## 2021-10-09 DIAGNOSIS — I10 Essential (primary) hypertension: Secondary | ICD-10-CM

## 2021-10-11 ENCOUNTER — Ambulatory Visit (HOSPITAL_COMMUNITY)
Admission: RE | Admit: 2021-10-11 | Discharge: 2021-10-11 | Disposition: A | Discharge status: Home / Self Care | Payer: PRIVATE HEALTH INSURANCE | Source: Ambulatory Visit | Attending: Family Medicine | Admitting: Family Medicine

## 2021-10-11 DIAGNOSIS — R0609 Other forms of dyspnea: Secondary | ICD-10-CM | POA: Diagnosis present

## 2021-10-11 LAB — ECHOCARDIOGRAM COMPLETE
Area-P 1/2: 3.08 cm2
S' Lateral: 2.8 cm

## 2021-10-11 NOTE — Progress Notes (Signed)
*  PRELIMINARY RESULTS* Echocardiogram 2D Echocardiogram has been performed.  Mark [name] 10/11/2021, 10:13 AM

## 2021-10-14 ENCOUNTER — Telehealth: Payer: Self-pay | Admitting: Family Medicine

## 2021-10-14 NOTE — Telephone Encounter (Signed)
Nurses Please talk with patient I would like to discuss his most recent test with him Please find out if he is available either at lunch on Tuesday or at the end of the day Please send me a reply thank you

## 2021-10-15 ENCOUNTER — Ambulatory Visit: Payer: Self-pay | Admitting: Family Medicine

## 2021-10-15 NOTE — Telephone Encounter (Signed)
I discussed the echo results with the patient Patient relates that he is still having significant DOE as well as chest discomfort with activity that is moderately strenuous.  Unable to do typical activities that he was able to do several weeks before for this started  Pulmonary function test was normal albuterol is not making any changes I do not feel that this is a pulmonary issue  I also looked at the printout of his stress test with significant ST segment depressions  We will reapproach Dr. Harl Bowie regarding possibility of them utilizing catheterization to definitively define what is going on

## 2021-10-15 NOTE — Telephone Encounter (Signed)
Patient says whatever time is good for you. He will be available to talk at lunch or the end of the day.

## 2021-10-16 ENCOUNTER — Telehealth: Payer: Self-pay

## 2021-10-16 DIAGNOSIS — R0609 Other forms of dyspnea: Secondary | ICD-10-CM

## 2021-10-16 DIAGNOSIS — R079 Chest pain, unspecified: Secondary | ICD-10-CM

## 2021-10-16 MED ORDER — METOPROLOL TARTRATE 50 MG PO TABS: 50.0000 mg | ORAL_TABLET | Freq: Once | ORAL | 0 refills | Status: DC

## 2021-10-16 NOTE — Telephone Encounter (Signed)
We did touch base with Dr. Harl Bowie Dr. Harl Bowie will be ordering CT coronary artery with flows Patient was informed of this Dr. Nelly Laurence office will reach out to him to schedule Patient was informed if he has not heard anything by early next week to connect with Dr. Harl Bowie or connect with [place] if having any difficulties

## 2021-10-16 NOTE — Telephone Encounter (Signed)
Called and spoke with patient and advised him of CCTA instructions.   Order placed- most recent BMET 5/24  Order placed for Metoprolol Tartrate '50mg'$  two hours prior to CCTA- Last HR was 74 on EKG  Instructions sent to patient via [place].   Advised patient to call back to office with any issues, questions, or concerns. Patient verbalized understanding.

## 2021-10-16 NOTE — Telephone Encounter (Signed)
-----   Message from Janina Mayo, MD sent at 10/15/2021  0:35 PM EDT ----- Certainly, one way to provide more clarity is a coronary CTA with FFR. This modality can help to determine if he has an obstructive lesion.   Hi Latoya Maulding, please order a coronary CTA morph for Mr. Pisarski.  Take Care, Dr. Harl Bowie  ----- Message ----- From: Kathyrn Drown, MD Sent: 10/15/2021  12:39 PM EDT To: Janina Mayo, MD  Hi Dr.Branch  Recently you saw a patient of ours-Mark [name]  He had a negative nuclear test for ischemia.  He had normal pulmonary function and the albuterol has not helped his DOE.  In addition to this I did do a echo which did show some mild LVH along with grade 1 diastolic dysfunction but not enough to explain his DOE.  It should also be noted that he has a strong cardiac family history.  Also that up until 3 weeks before his visit with me for DOE he was able to do fairly intensive work without chest tightness or shortness of breath.  Since then he notices if he does any type of significant work with significant workload he experiences chest tightness discomfort along with shortness of breath.  The nuclear images were negative for ischemia but EKGs of the treadmill shows significant ST segment depressions on lateral leads.  Also he was unable to get up to maximum heart rate-yet in the past before all of these episodes began he was able to push himself fairly good with exercise.  All of this makes me concerned-given his ongoing chest discomforts with significant activity as well as chest discomfort and shortness of breath and having to stop with minimal walking that he has potential coronary artery disease that just is not showing up yet.  I have had discussions with the patient.  He is interested in doing a catheterization if possible.  Just trying to be as certain as possible that something is not being overlooked.  His ongoing situation certainly indicates something is happening.  I am reaching out  to you-to ask that you reassess his situation-please feel free to respond or call-my cell phone-[phone]  I appreciate your input and hope that further testing could reveal more definitively what is or is not going on.  Thanks again for your help look forward to your response-[facility]

## 2021-10-21 ENCOUNTER — Encounter (HOSPITAL_COMMUNITY): Payer: Self-pay

## 2021-10-22 ENCOUNTER — Encounter: Payer: Self-pay | Admitting: Nurse Practitioner

## 2021-10-22 ENCOUNTER — Ambulatory Visit: Payer: Self-pay | Admitting: Nurse Practitioner

## 2021-10-22 VITALS — BP 126/92 | HR 86 | Temp 98.3°F

## 2021-10-22 DIAGNOSIS — Z789 Other specified health status: Secondary | ICD-10-CM

## 2021-10-22 NOTE — Progress Notes (Signed)
Subjective:  Patient ID: Mark [name]., male    DOB: [birth year]  Age: 61 y.o. MRN: [id number]  CC: Wellness Visit   HPI Mark [name]. presents for wellness exam visit for insurance benefit.  Patient has a PCP: Dr. Sallee Lange, last OV was 10/04/21 He has PMH significant for: HTN and HLD. Currently on valsartan and rosuvastatin.  Last labs per PCP were completed May 2023.  Reports he has been experiencing increased SOB with exertion along with chest pain and pain in shoulders and upper arm. He does not experience symptoms at rest. His PCP is currently working this up. States he has had several test done. He is following cardiology and pulmonology for this.   Health Maintenance:  Colonoscopy: Last completed in 2012 PSA: September 2022    Smoker: Never  Immunizations:  Shingrix- completed COVID- completed    Past Medical History:  Diagnosis Date   Cancer ([place]) 15-20 years ago   squamous cell carcinoma off face   GERD (gastroesophageal reflux disease)     Past Surgical History:  Procedure Laterality Date   BACK SURGERY     COLONOSCOPY  03/19/2011   Procedure: COLONOSCOPY;  Surgeon: Rogene Houston, MD;  [facility];  Service: Endoscopy;  Laterality: N/A;   SHOULDER SURGERY Left    skin cancer removed from face     UMBILICAL HERNIA REPAIR N/A 09/15/2014   Procedure: HERNIA REPAIR UMBILICAL ADULT;  Surgeon: III Dia Crawford, MD;  Location: [facility] ORS;  Service: General;  Laterality: N/A;   VASECTOMY      Outpatient Medications Prior to Visit  Medication Sig Dispense Refill   albuterol (VENTOLIN HFA) 108 (90 Base) MCG/ACT inhaler Inhale 2 puffs into the lungs every 4 (four) hours as needed for wheezing or shortness of breath. 8 g 4   APPLE CIDER VINEGAR PO Take by mouth daily.     famotidine (PEPCID) 20 MG tablet Take 1 tablet (20 mg total) by mouth 2 (two) times daily. 60 tablet 3   metoprolol tartrate (LOPRESSOR) 50 MG tablet Take 1 tablet (50 mg total)  by mouth once for 1 dose. PLEASE TAKE METOPROLOL 2  HOURS PRIOR TO CTA SCAN. 1 tablet 0   Multiple Vitamins-Minerals (MULTIVITAMINS THER. W/MINERALS) TABS Take 1 tablet by mouth daily.     OVER THE COUNTER MEDICATION [employer] eye vitamin     rosuvastatin (CRESTOR) 40 MG tablet Take 1 tablet (40 mg total) by mouth daily. 90 tablet 1   valsartan (DIOVAN) 40 MG tablet TAKE 1 TABLET BY MOUTH EVERY DAY 30 tablet 1   No facility-administered medications prior to visit.    ROS Review of Systems  Respiratory:  Positive for shortness of breath (with exertion).   Cardiovascular:  Positive for chest pain (with exertion; not present at rest.). Negative for leg swelling.    Objective:  BP (!) 126/92 (BP Location: Right Arm, Patient Position: Sitting)   Pulse 86   Temp 98.3 F (36.8 C)   Physical Exam Constitutional:      General: He is not in acute distress. HENT:     Head: Normocephalic.  Cardiovascular:     Rate and Rhythm: Normal rate and regular rhythm.     Heart sounds: Normal heart sounds.  Pulmonary:     Effort: Pulmonary effort is normal.  Musculoskeletal:        General: Normal range of motion.     Right lower leg: No edema.  Left lower leg: No edema.  Skin:    General: Skin is warm.  Neurological:     General: No focal deficit present.     Mental Status: He is alert and oriented to person, place, and time.  Psychiatric:        Mood and Affect: Mood normal.        Behavior: Behavior normal.     Assessment & Plan:   1. Participant in health and wellness plan Complete wellness physical was conducted today. Encouraged continued follow-up with PCP and specialist as recommended.  Reviewed age-recommended preventative screenings and immunizations. Patient is due for Colonoscopy but plans to defer this until he feels better.  Patient was advised yearly wellness exam Follow-up: Follow-up as needed.

## 2021-10-23 ENCOUNTER — Telehealth (HOSPITAL_COMMUNITY): Payer: Self-pay | Admitting: Emergency Medicine

## 2021-10-23 NOTE — Telephone Encounter (Signed)
Reaching out to patient to offer assistance regarding upcoming cardiac imaging study; pt verbalizes understanding of appt date/time, parking situation and where to check in, pre-test NPO status and medications ordered, and verified current allergies; name and call back number provided for further questions should they arise Marchia Bond RN Navigator Cardiac Imaging [facility] [phone] office [phone] cell  Denies iv issues Arrival 1000 '50mg'$  metoprolol tartrate

## 2021-10-23 NOTE — Telephone Encounter (Signed)
Attempted to call patient regarding upcoming cardiac CT appointment. °Left message on voicemail with name and callback number °Rayelle Armor RN Navigator Cardiac Imaging °[facility] °[phone] Office °[phone] Cell ° °

## 2021-10-24 ENCOUNTER — Other Ambulatory Visit: Payer: Self-pay | Admitting: Internal Medicine

## 2021-10-24 ENCOUNTER — Ambulatory Visit
Admission: RE | Admit: 2021-10-24 | Discharge: 2021-10-24 | Disposition: A | Discharge status: Home / Self Care | Payer: PRIVATE HEALTH INSURANCE | Source: Ambulatory Visit | Attending: Internal Medicine | Admitting: Internal Medicine

## 2021-10-24 DIAGNOSIS — R0609 Other forms of dyspnea: Secondary | ICD-10-CM | POA: Insufficient documentation

## 2021-10-24 DIAGNOSIS — R931 Abnormal findings on diagnostic imaging of heart and coronary circulation: Secondary | ICD-10-CM

## 2021-10-24 DIAGNOSIS — R079 Chest pain, unspecified: Secondary | ICD-10-CM | POA: Insufficient documentation

## 2021-10-24 DIAGNOSIS — I2510 Atherosclerotic heart disease of native coronary artery without angina pectoris: Secondary | ICD-10-CM

## 2021-10-24 MED ORDER — NITROGLYCERIN 0.4 MG SL SUBL
0.8000 mg | SUBLINGUAL_TABLET | Freq: Once | SUBLINGUAL | Status: AC
Start: 2021-10-24 — End: 2021-10-24
  Administered 2021-10-24: 0.8 mg via SUBLINGUAL

## 2021-10-24 MED ORDER — METOPROLOL TARTRATE 5 MG/5ML IV SOLN: 10.0000 mg | Freq: Once | INTRAVENOUS | Status: DC

## 2021-10-24 MED ORDER — IOHEXOL 350 MG/ML SOLN
75.0000 mL | Freq: Once | INTRAVENOUS | Status: AC | PRN
  Administered 2021-10-24: 75 mL via INTRAVENOUS

## 2021-10-24 MED ORDER — SODIUM CHLORIDE 0.9 % IV BOLUS
250.0000 mL | Freq: Once | INTRAVENOUS | Status: AC
  Administered 2021-10-24: 250 mL via INTRAVENOUS

## 2021-10-24 MED ORDER — DILTIAZEM HCL 25 MG/5ML IV SOLN: 10.0000 mg | Freq: Once | INTRAVENOUS | Status: DC

## 2021-10-24 MED ORDER — METOPROLOL TARTRATE 5 MG/5ML IV SOLN
10.0000 mg | Freq: Once | INTRAVENOUS | Status: AC
  Administered 2021-10-24: 10 mg via INTRAVENOUS

## 2021-10-24 NOTE — Progress Notes (Signed)
Patient tolerated CT well. Drank water after. Vital signs stable after 276m NS. Denies dizzy, lightheaded, nausea. encourage to drink water throughout day.Reasons explained and verbalized understanding. Ambulated steady gait.

## 2021-10-25 ENCOUNTER — Telehealth: Payer: Self-pay | Admitting: *Deleted

## 2021-10-25 ENCOUNTER — Encounter: Payer: Self-pay | Admitting: *Deleted

## 2021-10-25 ENCOUNTER — Other Ambulatory Visit: Payer: Self-pay | Admitting: *Deleted

## 2021-10-25 DIAGNOSIS — R079 Chest pain, unspecified: Secondary | ICD-10-CM

## 2021-10-25 DIAGNOSIS — R931 Abnormal findings on diagnostic imaging of heart and coronary circulation: Secondary | ICD-10-CM

## 2021-10-25 DIAGNOSIS — R0609 Other forms of dyspnea: Secondary | ICD-10-CM

## 2021-10-25 MED ORDER — ASPIRIN 81 MG PO TBEC: 81.0000 mg | DELAYED_RELEASE_TABLET | Freq: Every day | ORAL | 3 refills | Status: DC

## 2021-10-25 MED ORDER — SODIUM CHLORIDE 0.9% FLUSH: 3.0000 mL | Freq: Two times a day (BID) | INTRAVENOUS | Status: DC

## 2021-10-25 NOTE — Telephone Encounter (Signed)
Cath scheduled for 10/30/21 with dr cooper. The patient will go by [facility] in [place] for lab work. Instructions discussed with the patient and sent to him via my chart. He will start a baby aspirin 81 mg once daily.

## 2021-10-25 NOTE — Telephone Encounter (Signed)
-----   Message from Janina Mayo, MD sent at 10/25/2021  8:58 AM EDT ----- Jacqualine Mau, please schedule [name] for a LHC. We discussed his CT findings.  Shared Decision Making/Informed Consent The risks [stroke (1 in 1000), death (1 in 1000), kidney failure [usually temporary] (1 in 500), bleeding (1 in 200), allergic reaction [possibly serious] (1 in 200)], benefits (diagnostic support and management of coronary artery disease) and alternatives of a cardiac catheterization were discussed in detail with [name] and he is willing to proceed.

## 2021-10-29 ENCOUNTER — Encounter: Payer: Self-pay | Admitting: Internal Medicine

## 2021-10-29 ENCOUNTER — Ambulatory Visit (INDEPENDENT_AMBULATORY_CARE_PROVIDER_SITE_OTHER): Payer: PRIVATE HEALTH INSURANCE | Admitting: Internal Medicine

## 2021-10-29 VITALS — Ht 67.0 in | Wt 177.0 lb

## 2021-10-29 DIAGNOSIS — R079 Chest pain, unspecified: Secondary | ICD-10-CM

## 2021-10-29 NOTE — Progress Notes (Signed)
Cardiology Office Note:    Date:  10/29/2021   ID:  Mark Dell., DOB 06-25-1960, MRN [id number]  PCP:  Kathyrn Drown, MD   [facility] Providers Cardiologist:  Janina Mayo, MD     Referring MD: Kathyrn Drown, MD   No chief complaint on file. Chest pain  Virtual Visit via Video Note  I connected with Mark Dell. on 10/29/21 at  9:20 AM EDT by a video enabled telemedicine application and verified that I am speaking with the correct person using two identifiers.   I discussed the assessment and treatment plan with the patient. The patient was provided an opportunity to ask questions and all were answered. The patient agreed with the plan and demonstrated an understanding of the instructions.   The patient was advised to call back or seek an in-person evaluation if the symptoms worsen or if the condition fails to improve as anticipated.  I provided 15 minutes of non-face-to-face time during this encounter.   Janina Mayo, MD   History of Present Illness:   09/20/2021 Mark Jamar. is a 61 y.o. male with a hx of squamous cell carcinoma referral for shortness of breath, chest discomfort   He reports chest tightness and worsening dyspnea most intense on Saturday. He was doing lawn work, moving tree branches around. He is feeling more shortness of breath with walking. This has been going on for approximately 3 weeks duration. At rest he has not chest discomfort. He has no symptoms with ordinary activities.  He reports a stress test ~ 20 years ago. His EKG shows mild LVH with repol. Cxray showed chronic interstitial lung dx. Normal renal function. Blood pressures controlled at home with occasional screening. His father had MI in his 70s.   Interim Hx 10/27/2021 Mark [name] underwent Brantley Fling 09/27/2021. His EKG showed inferolateral TWI worsened with stress. His METS were 9.7 which is a good exercise response. However his MPHR was 77% (optimal is 85%). No inducible  ischemia or scar was identified. He continued to have symptoms and Dr. Wolfgang Phoenix was concerned. With suboptimal MPHR he suggested further risk stratification. I obtained a coronary CTA and he had FFR + proximal LAD and OM1 lesion. He is now planned for [facility] tomorrow 10/30/2021. He continues to have SOB with activity and chest tightness. No symptoms at rest. No bleeding issues. He is prepared for the procedure  Past Medical History:  Diagnosis Date   Cancer ([place]) 15-20 years ago   squamous cell carcinoma off face   GERD (gastroesophageal reflux disease)     Past Surgical History:  Procedure Laterality Date   BACK SURGERY     COLONOSCOPY  03/19/2011   Procedure: COLONOSCOPY;  Surgeon: Rogene Houston, MD;  [facility];  Service: Endoscopy;  Laterality: N/A;   SHOULDER SURGERY Left    skin cancer removed from face     UMBILICAL HERNIA REPAIR N/A 09/15/2014   Procedure: HERNIA REPAIR UMBILICAL ADULT;  Surgeon: III Dia Crawford, MD;  Location: [facility] ORS;  Service: General;  Laterality: N/A;   VASECTOMY      Current Medications: Current Outpatient Medications on File Prior to Visit  Medication Sig Dispense Refill   albuterol (VENTOLIN HFA) 108 (90 Base) MCG/ACT inhaler Inhale 2 puffs into the lungs every 4 (four) hours as needed for wheezing or shortness of breath. 8 g 4   aspirin EC 81 MG tablet Take 1 tablet (81 mg total) by mouth  daily. Swallow whole. 90 tablet 3   famotidine (PEPCID) 20 MG tablet Take 1 tablet (20 mg total) by mouth 2 (two) times daily. 60 tablet 3   Multiple Vitamins-Minerals (MULTIVITAMINS THER. W/MINERALS) TABS Take 1 tablet by mouth daily.     Multiple Vitamins-Minerals (PRESERVISION AREDS PO) Take 1 Capful by mouth in the morning and at bedtime.     rosuvastatin (CRESTOR) 40 MG tablet Take 1 tablet (40 mg total) by mouth daily. 90 tablet 1   valsartan (DIOVAN) 40 MG tablet TAKE 1 TABLET BY MOUTH EVERY DAY 30 tablet 1   Current Facility-Administered Medications  on File Prior to Visit  Medication Dose Route Frequency Provider Last Rate Last Admin   sodium chloride flush (NS) 0.9 % injection 3 mL  3 mL Intravenous Q12H Magdala Brahmbhatt, Royetta Crochet, MD        Allergies:   Patient has no known allergies.   Social History   Socioeconomic History   Marital status: Married    Spouse name: Not on file   Number of children: Not on file   Years of education: Not on file   Highest education level: Not on file  Occupational History   Not on file  Tobacco Use   Smoking status: Former    Packs/day: 1.00    Years: 15.00    Total pack years: 15.00    Types: Cigarettes    Start date: 29    Quit date: 09/04/1996    Years since quitting: 25.1   Smokeless tobacco: Never  Substance and Sexual Activity   Alcohol use: Yes    Comment: Rarely   Drug use: No   Sexual activity: Yes  Other Topics Concern   Not on file  Social History Narrative   ** Merged History Encounter **       Social Determinants of Health   Financial Resource Strain: Not on file  Food Insecurity: Not on file  Transportation Needs: Not on file  Physical Activity: Not on file  Stress: Not on file  Social Connections: Not on file     Family History: The patient's family history includes Heart disease in his father; Pulmonary fibrosis in his father. There is no history of Colon cancer.  ROS:   Please see the history of present illness.     All other systems reviewed and are negative.  EKGs/Labs/Other Studies Reviewed:    The following studies were reviewed today:   EKG:  EKG is  ordered today.  The ekg ordered today demonstrates   09/20/2021-NSR, LVH  Recent Labs: 09/18/2021: ALT 26; Hemoglobin 14.8; Platelets 264 10/02/2021: BNP 18.2; BUN 17; Creatinine, Ser 1.00; Potassium 4.4; Sodium 140   Recent Lipid Panel    Component Value Date/Time   CHOL 177 09/18/2021 1029   TRIG 53 09/18/2021 1029   HDL 75 09/18/2021 1029   CHOLHDL 2.4 09/18/2021 1029   CHOLHDL 3.0 07/02/2021  0830   VLDL 13 07/14/2013 0704   LDLCALC 91 09/18/2021 1029   LDLCALC 93 07/02/2021 0830     Risk Assessment/Calculations:           Physical Exam:    VS:   NA   Wt Readings from Last 3 Encounters:  10/29/21 177 lb (80.3 kg)  10/04/21 176 lb (79.8 kg)  10/03/21 176 lb 9.6 oz (80.1 kg)     NA  ASSESSMENT:    Angina: CCS II angina. With CVD risk (age, family hx, htn), will proceed with exercise nuclear stress was  sub-optimal per above. Coronary CTA showed + FFR prox LAD and OM1 lesion. Plan is for [facility] tomorrow. Continue asa and crestor 40 mg daily. LDL < 70 mg/dL.   HTN: continue valsartan 40 mg daily  PLAN:    In order of problems listed above:  LHC tomorrow Follow up in 3 months      Shared Decision Making/Informed Consent The risks [stroke (1 in 1000), death (1 in 1000), kidney failure [usually temporary] (1 in 500), bleeding (1 in 200), allergic reaction [possibly serious] (1 in 200)], benefits (diagnostic support and management of coronary artery disease) and alternatives of a cardiac catheterization were discussed in detail with Mark [name] and he is willing to proceed.  Medication Adjustments/Labs and Tests Ordered: Current medicines are reviewed at length with the patient today.  Concerns regarding medicines are outlined above.  No orders of the defined types were placed in this encounter.  No orders of the defined types were placed in this encounter.   Patient Instructions  Medication Instructions:  No Changes In Medications at this time.  *If you need a refill on your cardiac medications before your next appointment, please call your pharmacy*  Lab Work: None Ordered At This Time.  If you have labs (blood work) drawn today and your tests are completely normal, you will receive your results only by: [place] (if you have MyChart) OR A paper copy in the mail If you have any lab test that is abnormal or we need to change your treatment, we will  call you to review the results.  Testing/Procedures: None Ordered At This Time.   Follow-Up: At [facility], you and your health needs are our priority.  As part of our continuing mission to provide you with exceptional heart care, we have created designated Provider Care Teams.  These Care Teams include your primary Cardiologist (physician) and Advanced Practice Providers (APPs -  Physician Assistants and Nurse Practitioners) who all work together to provide you with the care you need, when you need it.  Your next appointment:   SEPTEMBER 27th at 3:00PM  The format for your next appointment:   In Person  Provider:   Janina Mayo, MD              Signed, Janina Mayo, MD  10/29/2021 9:34 AM    [place]

## 2021-10-29 NOTE — Patient Instructions (Signed)
Medication Instructions:  No Changes In Medications at this time.  *If you need a refill on your cardiac medications before your next appointment, please call your pharmacy*  Lab Work: None Ordered At This Time.  If you have labs (blood work) drawn today and your tests are completely normal, you will receive your results only by: [place] (if you have MyChart) OR A paper copy in the mail If you have any lab test that is abnormal or we need to change your treatment, we will call you to review the results.  Testing/Procedures: None Ordered At This Time.   Follow-Up: At [facility], you and your health needs are our priority.  As part of our continuing mission to provide you with exceptional heart care, we have created designated Provider Care Teams.  These Care Teams include your primary Cardiologist (physician) and Advanced Practice Providers (APPs -  Physician Assistants and Nurse Practitioners) who all work together to provide you with the care you need, when you need it.  Your next appointment:   SEPTEMBER 27th at 3:00PM  The format for your next appointment:   In Person  Provider:   Janina Mayo, MD

## 2021-10-29 NOTE — H&P (View-Only) (Signed)
Cardiology Office Note:    Date:  10/29/2021   ID:  Mark Dell., DOB 10-14-1960, MRN [id number]  PCP:  Kathyrn Drown, MD   [facility] Providers Cardiologist:  Janina Mayo, MD     Referring MD: Kathyrn Drown, MD   No chief complaint on file. Chest pain  Virtual Visit via Video Note  I connected with Mark Dell. on 10/29/21 at  9:20 AM EDT by a video enabled telemedicine application and verified that I am speaking with the correct person using two identifiers.   I discussed the assessment and treatment plan with the patient. The patient was provided an opportunity to ask questions and all were answered. The patient agreed with the plan and demonstrated an understanding of the instructions.   The patient was advised to call back or seek an in-person evaluation if the symptoms worsen or if the condition fails to improve as anticipated.  I provided 15 minutes of non-face-to-face time during this encounter.   Janina Mayo, MD   History of Present Illness:   09/20/2021 Mark Rands. is a 61 y.o. male with a hx of squamous cell carcinoma referral for shortness of breath, chest discomfort   He reports chest tightness and worsening dyspnea most intense on Saturday. He was doing lawn work, moving tree branches around. He is feeling more shortness of breath with walking. This has been going on for approximately 3 weeks duration. At rest he has not chest discomfort. He has no symptoms with ordinary activities.  He reports a stress test ~ 20 years ago. His EKG shows mild LVH with repol. Cxray showed chronic interstitial lung dx. Normal renal function. Blood pressures controlled at home with occasional screening. His father had MI in his 17s.   Interim Hx 10/27/2021 Mark [name] underwent Brantley Fling 09/27/2021. His EKG showed inferolateral TWI worsened with stress. His METS were 9.7 which is a good exercise response. However his MPHR was 77% (optimal is 85%). No inducible  ischemia or scar was identified. He continued to have symptoms and Dr. Wolfgang Phoenix was concerned. With suboptimal MPHR he suggested further risk stratification. I obtained a coronary CTA and he had FFR + proximal LAD and OM1 lesion. He is now planned for [facility] tomorrow 10/30/2021. He continues to have SOB with activity and chest tightness. No symptoms at rest. No bleeding issues. He is prepared for the procedure  Past Medical History:  Diagnosis Date   Cancer ([place]) 15-20 years ago   squamous cell carcinoma off face   GERD (gastroesophageal reflux disease)     Past Surgical History:  Procedure Laterality Date   BACK SURGERY     COLONOSCOPY  03/19/2011   Procedure: COLONOSCOPY;  Surgeon: Rogene Houston, MD;  [facility];  Service: Endoscopy;  Laterality: N/A;   SHOULDER SURGERY Left    skin cancer removed from face     UMBILICAL HERNIA REPAIR N/A 09/15/2014   Procedure: HERNIA REPAIR UMBILICAL ADULT;  Surgeon: III Dia Crawford, MD;  Location: [facility] ORS;  Service: General;  Laterality: N/A;   VASECTOMY      Current Medications: Current Outpatient Medications on File Prior to Visit  Medication Sig Dispense Refill   albuterol (VENTOLIN HFA) 108 (90 Base) MCG/ACT inhaler Inhale 2 puffs into the lungs every 4 (four) hours as needed for wheezing or shortness of breath. 8 g 4   aspirin EC 81 MG tablet Take 1 tablet (81 mg total) by mouth  daily. Swallow whole. 90 tablet 3   famotidine (PEPCID) 20 MG tablet Take 1 tablet (20 mg total) by mouth 2 (two) times daily. 60 tablet 3   Multiple Vitamins-Minerals (MULTIVITAMINS THER. W/MINERALS) TABS Take 1 tablet by mouth daily.     Multiple Vitamins-Minerals (PRESERVISION AREDS PO) Take 1 Capful by mouth in the morning and at bedtime.     rosuvastatin (CRESTOR) 40 MG tablet Take 1 tablet (40 mg total) by mouth daily. 90 tablet 1   valsartan (DIOVAN) 40 MG tablet TAKE 1 TABLET BY MOUTH EVERY DAY 30 tablet 1   Current Facility-Administered Medications  on File Prior to Visit  Medication Dose Route Frequency Provider Last Rate Last Admin   sodium chloride flush (NS) 0.9 % injection 3 mL  3 mL Intravenous Q12H Shantanique Hodo, Royetta Crochet, MD        Allergies:   Patient has no known allergies.   Social History   Socioeconomic History   Marital status: Married    Spouse name: Not on file   Number of children: Not on file   Years of education: Not on file   Highest education level: Not on file  Occupational History   Not on file  Tobacco Use   Smoking status: Former    Packs/day: 1.00    Years: 15.00    Total pack years: 15.00    Types: Cigarettes    Start date: 51    Quit date: 09/04/1996    Years since quitting: 25.1   Smokeless tobacco: Never  Substance and Sexual Activity   Alcohol use: Yes    Comment: Rarely   Drug use: No   Sexual activity: Yes  Other Topics Concern   Not on file  Social History Narrative   ** Merged History Encounter **       Social Determinants of Health   Financial Resource Strain: Not on file  Food Insecurity: Not on file  Transportation Needs: Not on file  Physical Activity: Not on file  Stress: Not on file  Social Connections: Not on file     Family History: The patient's family history includes Heart disease in his father; Pulmonary fibrosis in his father. There is no history of Colon cancer.  ROS:   Please see the history of present illness.     All other systems reviewed and are negative.  EKGs/Labs/Other Studies Reviewed:    The following studies were reviewed today:   EKG:  EKG is  ordered today.  The ekg ordered today demonstrates   09/20/2021-NSR, LVH  Recent Labs: 09/18/2021: ALT 26; Hemoglobin 14.8; Platelets 264 10/02/2021: BNP 18.2; BUN 17; Creatinine, Ser 1.00; Potassium 4.4; Sodium 140   Recent Lipid Panel    Component Value Date/Time   CHOL 177 09/18/2021 1029   TRIG 53 09/18/2021 1029   HDL 75 09/18/2021 1029   CHOLHDL 2.4 09/18/2021 1029   CHOLHDL 3.0 07/02/2021  0830   VLDL 13 07/14/2013 0704   LDLCALC 91 09/18/2021 1029   LDLCALC 93 07/02/2021 0830     Risk Assessment/Calculations:           Physical Exam:    VS:   NA   Wt Readings from Last 3 Encounters:  10/29/21 177 lb (80.3 kg)  10/04/21 176 lb (79.8 kg)  10/03/21 176 lb 9.6 oz (80.1 kg)     NA  ASSESSMENT:    Angina: CCS II angina. With CVD risk (age, family hx, htn), will proceed with exercise nuclear stress was  sub-optimal per above. Coronary CTA showed + FFR prox LAD and OM1 lesion. Plan is for [facility] tomorrow. Continue asa and crestor 40 mg daily. LDL < 70 mg/dL.   HTN: continue valsartan 40 mg daily  PLAN:    In order of problems listed above:  LHC tomorrow Follow up in 3 months      Shared Decision Making/Informed Consent The risks [stroke (1 in 1000), death (1 in 1000), kidney failure [usually temporary] (1 in 500), bleeding (1 in 200), allergic reaction [possibly serious] (1 in 200)], benefits (diagnostic support and management of coronary artery disease) and alternatives of a cardiac catheterization were discussed in detail with Mark [name] and he is willing to proceed.  Medication Adjustments/Labs and Tests Ordered: Current medicines are reviewed at length with the patient today.  Concerns regarding medicines are outlined above.  No orders of the defined types were placed in this encounter.  No orders of the defined types were placed in this encounter.   Patient Instructions  Medication Instructions:  No Changes In Medications at this time.  *If you need a refill on your cardiac medications before your next appointment, please call your pharmacy*  Lab Work: None Ordered At This Time.  If you have labs (blood work) drawn today and your tests are completely normal, you will receive your results only by: Anon Raices (if you have MyChart) OR A paper copy in the mail If you have any lab test that is abnormal or we need to change your treatment, we will  call you to review the results.  Testing/Procedures: None Ordered At This Time.   Follow-Up: At [facility], you and your health needs are our priority.  As part of our continuing mission to provide you with exceptional heart care, we have created designated Provider Care Teams.  These Care Teams include your primary Cardiologist (physician) and Advanced Practice Providers (APPs -  Physician Assistants and Nurse Practitioners) who all work together to provide you with the care you need, when you need it.  Your next appointment:   SEPTEMBER 27th at 3:00PM  The format for your next appointment:   In Person  Provider:   Janina Mayo, MD              Signed, Janina Mayo, MD  10/29/2021 9:34 AM    [place]

## 2021-10-30 ENCOUNTER — Ambulatory Visit (HOSPITAL_COMMUNITY)
Admission: RE | Admit: 2021-10-30 | Discharge: 2021-10-30 | Disposition: A | Discharge status: Home / Self Care | Payer: PRIVATE HEALTH INSURANCE | Attending: Cardiovascular Disease | Admitting: Cardiovascular Disease

## 2021-10-30 ENCOUNTER — Encounter (HOSPITAL_COMMUNITY)
Admission: RE | Disposition: A | Discharge status: Home / Self Care | Payer: Self-pay | Source: Home / Self Care | Attending: Cardiovascular Disease

## 2021-10-30 ENCOUNTER — Other Ambulatory Visit: Payer: Self-pay

## 2021-10-30 DIAGNOSIS — I2510 Atherosclerotic heart disease of native coronary artery without angina pectoris: Secondary | ICD-10-CM

## 2021-10-30 DIAGNOSIS — R079 Chest pain, unspecified: Secondary | ICD-10-CM

## 2021-10-30 DIAGNOSIS — I25119 Atherosclerotic heart disease of native coronary artery with unspecified angina pectoris: Secondary | ICD-10-CM | POA: Insufficient documentation

## 2021-10-30 DIAGNOSIS — Z8249 Family history of ischemic heart disease and other diseases of the circulatory system: Secondary | ICD-10-CM | POA: Insufficient documentation

## 2021-10-30 DIAGNOSIS — Z87891 Personal history of nicotine dependence: Secondary | ICD-10-CM | POA: Insufficient documentation

## 2021-10-30 DIAGNOSIS — I25118 Atherosclerotic heart disease of native coronary artery with other forms of angina pectoris: Secondary | ICD-10-CM

## 2021-10-30 DIAGNOSIS — R0609 Other forms of dyspnea: Secondary | ICD-10-CM

## 2021-10-30 HISTORY — PX: LEFT HEART CATH AND CORONARY ANGIOGRAPHY: CATH118249

## 2021-10-30 LAB — BASIC METABOLIC PANEL
BUN/Creatinine Ratio: 18 (ref 10–24)
BUN: 16 mg/dL (ref 8–27)
CO2: 20 mmol/L (ref 20–29)
Calcium: 9.5 mg/dL (ref 8.6–10.2)
Chloride: 104 mmol/L (ref 96–106)
Creatinine, Ser: 0.9 mg/dL (ref 0.76–1.27)
Glucose: 96 mg/dL (ref 70–99)
Potassium: 4.4 mmol/L (ref 3.5–5.2)
Sodium: 141 mmol/L (ref 134–144)
eGFR: 97 mL/min/{1.73_m2} (ref >59)

## 2021-10-30 LAB — CBC
Hematocrit: 41 % (ref 37.5–51.0)
Hemoglobin: 14.1 g/dL (ref 13.0–17.7)
MCH: 31.8 pg (ref 26.6–33.0)
MCHC: 34.4 g/dL (ref 31.5–35.7)
MCV: 92 fL (ref 79–97)
Platelets: 229 10*3/uL (ref 150–450)
RBC: 4.44 x10E6/uL (ref 4.14–5.80)
RDW: 12.3 % (ref 11.6–15.4)
WBC: 5 10*3/uL (ref 3.4–10.8)

## 2021-10-30 SURGERY — LEFT HEART CATH AND CORONARY ANGIOGRAPHY
Anesthesia: LOCAL

## 2021-10-30 MED ORDER — ASPIRIN 81 MG PO CHEW: 81.0000 mg | CHEWABLE_TABLET | ORAL | Status: DC

## 2021-10-30 MED ORDER — SODIUM CHLORIDE 0.9 % IV SOLN: 250.0000 mL | INTRAVENOUS | Status: DC | PRN

## 2021-10-30 MED ORDER — LIDOCAINE HCL (PF) 1 % IJ SOLN
INTRAMUSCULAR | Status: AC
  Filled 2021-10-30: qty 30

## 2021-10-30 MED ORDER — LABETALOL HCL 5 MG/ML IV SOLN: 10.0000 mg | INTRAVENOUS | Status: DC | PRN

## 2021-10-30 MED ORDER — MIDAZOLAM HCL 2 MG/2ML IJ SOLN
INTRAMUSCULAR | Status: AC
  Filled 2021-10-30: qty 2

## 2021-10-30 MED ORDER — SODIUM CHLORIDE 0.9% FLUSH: 3.0000 mL | INTRAVENOUS | Status: DC | PRN

## 2021-10-30 MED ORDER — IOHEXOL 350 MG/ML SOLN
INTRAVENOUS | Status: DC | PRN
  Administered 2021-10-30: 35 mL via INTRA_ARTERIAL

## 2021-10-30 MED ORDER — SODIUM CHLORIDE 0.9 % WEIGHT BASED INFUSION
3.0000 mL/kg/h | INTRAVENOUS | Status: AC
  Administered 2021-10-30: 3 mL/kg/h via INTRAVENOUS

## 2021-10-30 MED ORDER — HYDRALAZINE HCL 20 MG/ML IJ SOLN: 10.0000 mg | INTRAMUSCULAR | Status: DC | PRN

## 2021-10-30 MED ORDER — HEPARIN SODIUM (PORCINE) 1000 UNIT/ML IJ SOLN
INTRAMUSCULAR | Status: AC
  Filled 2021-10-30: qty 10

## 2021-10-30 MED ORDER — MIDAZOLAM HCL 2 MG/2ML IJ SOLN
INTRAMUSCULAR | Status: DC | PRN
  Administered 2021-10-30: 2 mg via INTRAVENOUS

## 2021-10-30 MED ORDER — ACETAMINOPHEN 325 MG PO TABS: 650.0000 mg | ORAL_TABLET | ORAL | Status: DC | PRN

## 2021-10-30 MED ORDER — SODIUM CHLORIDE 0.9 % WEIGHT BASED INFUSION: 1.0000 mL/kg/h | INTRAVENOUS | Status: DC

## 2021-10-30 MED ORDER — FENTANYL CITRATE (PF) 100 MCG/2ML IJ SOLN
INTRAMUSCULAR | Status: DC | PRN
  Administered 2021-10-30: 25 ug via INTRAVENOUS

## 2021-10-30 MED ORDER — METOPROLOL TARTRATE 25 MG PO TABS: 25.0000 mg | ORAL_TABLET | Freq: Two times a day (BID) | ORAL | 11 refills | Status: DC

## 2021-10-30 MED ORDER — HEPARIN (PORCINE) IN NACL 1000-0.9 UT/500ML-% IV SOLN
INTRAVENOUS | Status: DC | PRN
  Administered 2021-10-30 (×2): 500 mL

## 2021-10-30 MED ORDER — LIDOCAINE HCL (PF) 1 % IJ SOLN
INTRAMUSCULAR | Status: DC | PRN
  Administered 2021-10-30: 2 mL via INTRADERMAL

## 2021-10-30 MED ORDER — VERAPAMIL HCL 2.5 MG/ML IV SOLN
INTRAVENOUS | Status: DC | PRN
  Administered 2021-10-30: 10 mL via INTRA_ARTERIAL

## 2021-10-30 MED ORDER — HEPARIN SODIUM (PORCINE) 1000 UNIT/ML IJ SOLN
INTRAMUSCULAR | Status: DC | PRN
  Administered 2021-10-30: 5000 [IU] via INTRAVENOUS

## 2021-10-30 MED ORDER — SODIUM CHLORIDE 0.9% FLUSH: 3.0000 mL | Freq: Two times a day (BID) | INTRAVENOUS | Status: DC

## 2021-10-30 MED ORDER — ONDANSETRON HCL 4 MG/2ML IJ SOLN: 4.0000 mg | Freq: Four times a day (QID) | INTRAMUSCULAR | Status: DC | PRN

## 2021-10-30 MED ORDER — FENTANYL CITRATE (PF) 100 MCG/2ML IJ SOLN
INTRAMUSCULAR | Status: AC
  Filled 2021-10-30: qty 2

## 2021-10-30 MED ORDER — VERAPAMIL HCL 2.5 MG/ML IV SOLN
INTRAVENOUS | Status: AC
Start: 2021-10-30 — End: ?
  Filled 2021-10-30: qty 2

## 2021-10-30 MED ORDER — HEPARIN (PORCINE) IN NACL 1000-0.9 UT/500ML-% IV SOLN
INTRAVENOUS | Status: AC
  Filled 2021-10-30: qty 1000

## 2021-10-30 SURGICAL SUPPLY — 9 items
BAND ZEPHYR COMPRESS 30 LONG (HEMOSTASIS) ×1 IMPLANT
CATH 5FR JL3.5 JR4 ANG PIG MP (CATHETERS) ×1 IMPLANT
GLIDESHEATH SLEND SS 6F .021 (SHEATH) ×1 IMPLANT
GUIDEWIRE INQWIRE 1.5J.035X260 (WIRE) IMPLANT
INQWIRE 1.5J .035X260CM (WIRE) ×2
KIT HEART LEFT (KITS) ×2 IMPLANT
PACK CARDIAC CATHETERIZATION (CUSTOM PROCEDURE TRAY) ×2 IMPLANT
TRANSDUCER W/STOPCOCK (MISCELLANEOUS) ×2 IMPLANT
TUBING CIL FLEX 10 FLL-RA (TUBING) ×2 IMPLANT

## 2021-10-30 NOTE — Discharge Instructions (Signed)
Radial Site Care  This sheet gives you information about how to care for yourself after your procedure. Your health care provider may also give you more specific instructions. If you have problems or questions, contact your health care provider. What can I expect after the procedure? After the procedure, it is common to have: Bruising and tenderness at the catheter insertion area. Follow these instructions at home: Medicines Take over-the-counter and prescription medicines only as told by your health care provider. Insertion site care Follow instructions from your health care provider about how to take care of your insertion site. Make sure you: Wash your hands with soap and water before you remove your bandage (dressing). If soap and water are not available, use hand sanitizer. May remove dressing in 24 hours. Check your insertion site every day for signs of infection. Check for: Redness, swelling, or pain. Fluid or blood. Pus or a bad smell. Warmth. Do no take baths, swim, or use a hot tub for 5 days. You may shower 24-48 hours after the procedure. Remove the dressing and gently wash the site with plain soap and water. Pat the area dry with a clean towel. Do not rub the site. That could cause bleeding. Do not apply powder or lotion to the site. Activity  For 24 hours after the procedure, or as directed by your health care provider: Do not flex or bend the affected arm. Do not push or pull heavy objects with the affected arm. Do not drive yourself home from the hospital or clinic. You may drive 24 hours after the procedure. Do not operate machinery or power tools. KEEP ARM ELEVATED THE REMAINDER OF THE DAY. Do not push, pull or lift anything that is heavier than 10 lb for 5 days. Ask your health care provider when it is okay to: Return to work or school. Resume usual physical activities or sports. Resume sexual activity. General instructions If the catheter site starts to  bleed, raise your arm and put firm pressure on the site. If the bleeding does not stop, get help right away. This is a medical emergency. DRINK PLENTY OF FLUIDS FOR THE NEXT 2-3 DAYS. No alcohol consumption for 24 hours after receiving sedation. If you went home on the same day as your procedure, a responsible adult should be with you for the first 24 hours after you arrive home. Keep all follow-up visits as told by your health care provider. This is important. Contact a health care provider if: You have a fever. You have redness, swelling, or yellow drainage around your insertion site. Get help right away if: You have unusual pain at the radial site. The catheter insertion area swells very fast. The insertion area is bleeding, and the bleeding does not stop when you hold steady pressure on the area. Your arm or hand becomes pale, cool, tingly, or numb. These symptoms may represent a serious problem that is an emergency. Do not wait to see if the symptoms will go away. Get medical help right away. Call your local emergency services (911 in the [place] Do not drive yourself to the hospital. Summary After the procedure, it is common to have bruising and tenderness at the site. Follow instructions from your health care provider about how to take care of your radial site wound. Check the wound every day for signs of infection.  This information is not intended to replace advice given to you by your health care provider. Make sure you discuss any questions you have with   your health care provider. Document Revised: 06/03/2017 Document Reviewed: 06/03/2017 Elsevier Patient Education  2020 Elsevier Inc.  

## 2021-10-30 NOTE — Interval H&P Note (Signed)
Cath Lab Visit (complete for each Cath Lab visit)  Clinical Evaluation Leading to the Procedure:   ACS: No.  Non-ACS:    Anginal Classification: CCS III  Anti-ischemic medical therapy: Minimal Therapy (1 class of medications)  Non-Invasive Test Results: Intermediate-risk stress test findings: cardiac mortality 1-3%/year  Prior CABG: No previous CABG      History and Physical Interval Note:  10/30/2021 9:33 AM  Mark [name].  has presented today for surgery, with the diagnosis of abnormal cardiac ct.  The various methods of treatment have been discussed with the patient and family. After consideration of risks, benefits and other options for treatment, the patient has consented to  Procedure(s): LEFT HEART CATH AND CORONARY ANGIOGRAPHY (N/A) as a surgical intervention.  The patient's history has been reviewed, patient examined, no change in status, stable for surgery.  I have reviewed the patient's chart and labs.  Questions were answered to the patient's satisfaction.     Sherren Mocha

## 2021-10-31 ENCOUNTER — Telehealth: Payer: Self-pay | Admitting: Family Medicine

## 2021-10-31 ENCOUNTER — Encounter (HOSPITAL_COMMUNITY): Payer: Self-pay | Admitting: Cardiovascular Disease

## 2021-10-31 ENCOUNTER — Telehealth: Payer: Self-pay

## 2021-10-31 NOTE — Telephone Encounter (Signed)
This patient recently had catheterization being evaluated for CABG  I spoke with him today He does not need to follow-up on Monday with myself He is aware that we are canceling Mondays appointment Please cancel appointment on the schedule for Monday Thanks-Dr. Nicki Reaper

## 2021-10-31 NOTE — Telephone Encounter (Signed)
Patient contacted the office concerned as to if he should continue to take his 81 mg ASA. He is to see Dr. Kipp Brood 6/30 and did not want to "mess" anything up before coming to this appointment, especially if he was going to have surgery scheduled. Advised that if he is concerned about taking the medication, he should contact his Cardiologist that placed him on the medication, but that our office would recommend what Cardiology advises. He acknowledged receipt and stated that he was going to continue to take it.

## 2021-10-31 NOTE — Telephone Encounter (Signed)
I had discussion with the patient regarding his catheterization findings.  He will be seeing vascular cardiothoracic surgery in the near future for consideration for CABG I did instruct the patient to stay with his current medications but not to do any strenuous activity that trigger his chest tightness or shortness of breath.  Understandably patient is stressed about the diagnosis but is also hopeful that all will go well  There is no reason for him to follow-up with [place] this coming week, we are here if he has additional questions concerns or issues, he voiced understanding

## 2021-11-01 ENCOUNTER — Ambulatory Visit: Payer: PRIVATE HEALTH INSURANCE | Admitting: Internal Medicine

## 2021-11-04 ENCOUNTER — Ambulatory Visit: Payer: Self-pay | Admitting: Family Medicine

## 2021-11-07 NOTE — H&P (View-Only) (Signed)
[address]       [place] [address]             [phone]        Mark [name] Medical Record #[id number] Date of Birth: 06/28/1960  Referring: Sherren Mocha, MD Primary Care: Kathyrn Drown, MD Primary Cardiologist:Branch, Royetta Crochet, MD  Chief Complaint:    Chief Complaint  Patient presents with   Coronary Artery Disease    Surgical consult, Cardiac cath and Echo 10/30/21     History of Present Illness:     61 year old male referred for surgical evaluation of three-vessel coronary artery disease.  Over the past several months he has had progressive exertional dyspnea and anginal symptoms.  After thorough evaluation, he underwent a left heart cath which showed three-vessel disease.  He does admit to some orthopnea.  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   Past Medical and Surgical History: Previous Chest Surgery: No Previous Chest Radiation: No Diabetes Mellitus: No.  HbA1C 5.5 Creatinine: 0.9  Past Medical History:  Diagnosis Date   Cancer ([place]) 15-20 years ago   squamous cell carcinoma off face   GERD (gastroesophageal reflux disease)     Past Surgical History:  Procedure Laterality Date   BACK SURGERY     COLONOSCOPY  03/19/2011   Procedure: COLONOSCOPY;  Surgeon: Rogene Houston, MD;  [facility];  Service: Endoscopy;  Laterality: N/A;   LEFT HEART CATH AND CORONARY ANGIOGRAPHY N/A 10/30/2021   Procedure: LEFT HEART CATH AND CORONARY ANGIOGRAPHY;  Surgeon: Sherren Mocha, MD;  Location: [place] [facility];  Service: Cardiovascular;  Laterality: N/A;   SHOULDER SURGERY Left    skin cancer removed from face     UMBILICAL HERNIA REPAIR N/A 09/15/2014   Procedure: HERNIA REPAIR UMBILICAL ADULT;  Surgeon: III Dia Crawford, MD;  Location: [facility] ORS;  Service: General;  Laterality: N/A;   VASECTOMY      Social History: Support: Lives with his  wife  Social History   Tobacco Use  Smoking Status Former   Packs/day: 1.00   Years: 15.00   Total pack years: 15.00   Types: Cigarettes   Start date: 74   Quit date: 09/04/1996   Years since quitting: 25.1  Smokeless Tobacco Never    Social History   Substance and Sexual Activity  Alcohol Use Yes   Comment: Rarely     No Known Allergies    Current Outpatient Medications  Medication Sig Dispense Refill   albuterol (VENTOLIN HFA) 108 (90 Base) MCG/ACT inhaler Inhale 2 puffs into the lungs every 4 (four) hours as needed for wheezing or shortness of breath. 8 g 4   aspirin EC 81 MG tablet Take 1 tablet (81 mg total) by mouth daily. Swallow whole. 90 tablet 3   famotidine (PEPCID) 20 MG tablet Take 1 tablet (20 mg total) by mouth 2 (two) times daily. 60 tablet 3   metoprolol tartrate (LOPRESSOR) 25 MG tablet Take 1 tablet (25 mg total) by mouth 2 (two) times daily. 60 tablet 11   Multiple Vitamins-Minerals (MULTIVITAMINS THER. W/MINERALS) TABS Take 1 tablet by mouth daily.     Multiple Vitamins-Minerals (PRESERVISION AREDS PO) Take 1 Capful by mouth in the morning and at bedtime.     rosuvastatin (CRESTOR) 40 MG tablet Take 1 tablet (40 mg total) by mouth daily. 90 tablet 1  valsartan (DIOVAN) 40 MG tablet TAKE 1 TABLET BY MOUTH EVERY DAY 30 tablet 1   Current Facility-Administered Medications  Medication Dose Route Frequency Provider Last Rate Last Admin   sodium chloride flush (NS) 0.9 % injection 3 mL  3 mL Intravenous Q12H Branch, Royetta Crochet, MD        (Not in a hospital admission)   Family History  Problem Relation Age of Onset   Heart disease Father    Pulmonary fibrosis Father    Colon cancer Neg Hx      Review of Systems:   Review of Systems  Constitutional:  Positive for malaise/fatigue.  Respiratory:  Positive for shortness of breath.   Cardiovascular:  Positive for chest pain and orthopnea.      Physical Exam: BP (!) 156/90   Pulse 62   Resp 20    Ht '5\' 7"'$  (1.702 m)   Wt 175 lb (79.4 kg)   SpO2 96% Comment: RA  BMI 27.41 kg/m  Physical Exam    Diagnostic Studies & Laboratory data:    Left Heart Catherization:  Intervention  Echo: IMPRESSIONS     1. Left ventricular ejection fraction, by estimation, is 60 to 65%. The  left ventricle has normal function. The left ventricle has no regional  wall motion abnormalities. There is mild left ventricular hypertrophy.  Left ventricular diastolic parameters  are consistent with Grade I diastolic dysfunction (impaired relaxation).   2. Right ventricular systolic function is normal. The right ventricular  size is normal. Tricuspid regurgitation signal is inadequate for assessing  PA pressure.   3. The mitral valve is normal in structure. No evidence of mitral valve  regurgitation. No evidence of mitral stenosis.   4. The aortic valve is tricuspid. Aortic valve regurgitation is trivial.  Aortic valve sclerosis is present, with no evidence of aortic valve  stenosis.   5. The inferior vena cava is normal in size with greater than 50%  respiratory variability, suggesting right atrial pressure of 3 mmHg.  EKG: Sinus brady I have independently reviewed the above radiologic studies and discussed with the patient   Recent Lab Findings: Lab Results  Component Value Date   WBC 5.0 10/29/2021   HGB 14.1 10/29/2021   HCT 41.0 10/29/2021   PLT 229 10/29/2021   GLUCOSE 96 10/29/2021   CHOL 177 09/18/2021   TRIG 53 09/18/2021   HDL 75 09/18/2021   LDLCALC 91 09/18/2021   ALT 26 09/18/2021   AST 31 09/18/2021   NA 141 10/29/2021   K 4.4 10/29/2021   CL 104 10/29/2021   CREATININE 0.90 10/29/2021   BUN 16 10/29/2021   CO2 20 10/29/2021   HGBA1C 5.5 06/23/2016      Assessment / Plan:   61 yo male with 3V CAD.  We discussed the risks and benefits of surgical revascularization he is agreeable to proceed.  He is tentatively scheduled for early July.  We also discussed the use of  the radial artery but given his not working for not to use that graft.  We will plan for CABG 4      I  spent 40 minutes counseling the patient face to face.   Mark [name] 11/08/2021 10:23 AM

## 2021-11-07 NOTE — Progress Notes (Signed)
[address]       [place] [address]             [id number]        Mark [place]. [place] Medical Record #[id number] Date of Birth: 1961/01/18  Referring: Sherren Mocha, MD Primary Care: Kathyrn Drown, MD Primary Cardiologist:Branch, Royetta Crochet, MD  Chief Complaint:    Chief Complaint  Patient presents with   Coronary Artery Disease    Surgical consult, Cardiac cath and Echo 10/30/21     History of Present Illness:     61 year old [name] referred for surgical evaluation of three-vessel coronary artery disease.  Over the past several months he has had progressive exertional dyspnea and anginal symptoms.  After thorough evaluation, he underwent a left heart cath which showed three-vessel disease.  He does admit to some orthopnea.  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   Past Medical and Surgical History: Previous Chest Surgery: No Previous Chest Radiation: No Diabetes Mellitus: No.  HbA1C 5.5 Creatinine: 0.9  Past Medical History:  Diagnosis Date   Cancer ([place]) 15-20 years ago   squamous cell carcinoma off face   GERD (gastroesophageal reflux disease)     Past Surgical History:  Procedure Laterality Date   BACK SURGERY     COLONOSCOPY  03/19/2011   Procedure: COLONOSCOPY;  Surgeon: Rogene Houston, MD;  Location[facility];  Service: Endoscopy;  Laterality: N/A;   LEFT HEART CATH AND CORONARY ANGIOGRAPHY N/A 10/30/2021   Procedure: LEFT HEART CATH AND CORONARY ANGIOGRAPHY;  Surgeon: Sherren Mocha, MD;  Location: [place] [facility];  Service: Cardiovascular;  Laterality: N/A;   SHOULDER SURGERY Left    skin cancer removed from face     UMBILICAL HERNIA REPAIR N/A 09/15/2014   Procedure: HERNIA REPAIR UMBILICAL ADULT;  Surgeon: III Dia Crawford, MD;  Location: [facility] ORS;  Service: General;  Laterality: N/A;   VASECTOMY      Social History: Support: Lives with his  wife  Social History   Tobacco Use  Smoking Status Former   Packs/day: 1.00   Years: 15.00   Total pack years: 15.00   Types: Cigarettes   Start date: 66   Quit date: 09/04/1996   Years since quitting: 25.1  Smokeless Tobacco Never    Social History   Substance and Sexual Activity  Alcohol Use Yes   Comment: Rarely     No Known Allergies    Current Outpatient Medications  Medication Sig Dispense Refill   albuterol (VENTOLIN HFA) 108 (90 Base) MCG/ACT inhaler Inhale 2 puffs into the lungs every 4 (four) hours as needed for wheezing or shortness of breath. 8 g 4   aspirin EC 81 MG tablet Take 1 tablet (81 mg total) by mouth daily. Swallow whole. 90 tablet 3   famotidine (PEPCID) 20 MG tablet Take 1 tablet (20 mg total) by mouth 2 (two) times daily. 60 tablet 3   metoprolol tartrate (LOPRESSOR) 25 MG tablet Take 1 tablet (25 mg total) by mouth 2 (two) times daily. 60 tablet 11   Multiple Vitamins-Minerals (MULTIVITAMINS THER. W/MINERALS) TABS Take 1 tablet by mouth daily.     Multiple Vitamins-Minerals (PRESERVISION AREDS PO) Take 1 Capful by mouth in the morning and at bedtime.     rosuvastatin (CRESTOR) 40 MG tablet Take 1 tablet (40 mg total) by mouth daily. 90 tablet 1  valsartan (DIOVAN) 40 MG tablet TAKE 1 TABLET BY MOUTH EVERY DAY 30 tablet 1   Current Facility-Administered Medications  Medication Dose Route Frequency Provider Last Rate Last Admin   sodium chloride flush (NS) 0.9 % injection 3 mL  3 mL Intravenous Q12H Branch, Royetta Crochet, MD        (Not in a hospital admission)   Family History  Problem Relation Age of Onset   Heart disease Father    Pulmonary fibrosis Father    Colon cancer Neg Hx      Review of Systems:   Review of Systems  Constitutional:  Positive for malaise/fatigue.  Respiratory:  Positive for shortness of breath.   Cardiovascular:  Positive for chest pain and orthopnea.      Physical Exam: BP (!) 156/90   Pulse 62   Resp 20    Ht '5\' 7"'$  (1.702 m)   Wt 175 lb (Mark.4 kg)   SpO2 96% Comment: RA  BMI 27.41 kg/m  Physical Exam    Diagnostic Studies & Laboratory data:    Left Heart Catherization:  Intervention  Echo: IMPRESSIONS     1. Left ventricular ejection fraction, by estimation, is 60 to 65%. The  left ventricle has normal function. The left ventricle has no regional  wall motion abnormalities. There is mild left ventricular hypertrophy.  Left ventricular diastolic parameters  are consistent with Grade I diastolic dysfunction (impaired relaxation).   2. Right ventricular systolic function is normal. The right ventricular  size is normal. Tricuspid regurgitation signal is inadequate for assessing  PA pressure.   3. The mitral valve is normal in structure. No evidence of mitral valve  regurgitation. No evidence of mitral stenosis.   4. The aortic valve is tricuspid. Aortic valve regurgitation is trivial.  Aortic valve sclerosis is present, with no evidence of aortic valve  stenosis.   5. The inferior vena cava is normal in size with greater than 50%  respiratory variability, suggesting right atrial pressure of 3 mmHg.  EKG: Sinus brady I have independently reviewed the above radiologic studies and discussed with the patient   Recent Lab Findings: Lab Results  Component Value Date   WBC 5.0 10/29/2021   HGB 14.1 10/29/2021   HCT 41.0 10/29/2021   PLT 229 10/29/2021   GLUCOSE 96 10/29/2021   CHOL 177 09/18/2021   TRIG 53 09/18/2021   HDL 75 09/18/2021   LDLCALC 91 09/18/2021   ALT 26 09/18/2021   AST 31 09/18/2021   NA 141 10/29/2021   K 4.4 10/29/2021   CL 104 10/29/2021   CREATININE 0.90 10/29/2021   BUN 16 10/29/2021   CO2 20 10/29/2021   HGBA1C 5.5 06/23/2016      Assessment / Plan:   61 yo [name] with 3V CAD.  We discussed the risks and benefits of surgical revascularization he is agreeable to proceed.  He is tentatively scheduled for early July.  We also discussed the use of  the radial artery but given his not working for not to use that graft.  We will plan for CABG 4      I  spent 40 minutes counseling the patient face to face.   Lajuana Matte 11/08/2021 10:23 AM

## 2021-11-08 ENCOUNTER — Other Ambulatory Visit: Payer: Self-pay | Admitting: *Deleted

## 2021-11-08 ENCOUNTER — Institutional Professional Consult (permissible substitution) (INDEPENDENT_AMBULATORY_CARE_PROVIDER_SITE_OTHER): Payer: PRIVATE HEALTH INSURANCE | Admitting: Thoracic Surgery (Cardiothoracic Vascular Surgery)

## 2021-11-08 VITALS — BP 156/90 | HR 62 | Resp 20 | Ht 67.0 in | Wt 175.0 lb

## 2021-11-08 DIAGNOSIS — I2510 Atherosclerotic heart disease of native coronary artery without angina pectoris: Secondary | ICD-10-CM | POA: Diagnosis not present

## 2021-11-11 ENCOUNTER — Encounter (HOSPITAL_COMMUNITY)
Admission: RE | Admit: 2021-11-11 | Discharge: 2021-11-11 | Disposition: A | Discharge status: Home / Self Care | Payer: PRIVATE HEALTH INSURANCE | Source: Ambulatory Visit | Attending: Thoracic Surgery (Cardiothoracic Vascular Surgery) | Admitting: Thoracic Surgery (Cardiothoracic Vascular Surgery)

## 2021-11-11 ENCOUNTER — Encounter (HOSPITAL_COMMUNITY): Payer: Self-pay

## 2021-11-11 ENCOUNTER — Ambulatory Visit (HOSPITAL_BASED_OUTPATIENT_CLINIC_OR_DEPARTMENT_OTHER)
Admission: RE | Admit: 2021-11-11 | Discharge: 2021-11-11 | Disposition: A | Discharge status: Home / Self Care | Payer: PRIVATE HEALTH INSURANCE | Source: Ambulatory Visit | Attending: Thoracic Surgery (Cardiothoracic Vascular Surgery) | Admitting: Thoracic Surgery (Cardiothoracic Vascular Surgery)

## 2021-11-11 ENCOUNTER — Other Ambulatory Visit: Payer: Self-pay

## 2021-11-11 ENCOUNTER — Ambulatory Visit (HOSPITAL_COMMUNITY)
Admission: RE | Admit: 2021-11-11 | Discharge: 2021-11-11 | Disposition: A | Discharge status: Home / Self Care | Payer: PRIVATE HEALTH INSURANCE | Source: Ambulatory Visit | Attending: Thoracic Surgery (Cardiothoracic Vascular Surgery) | Admitting: Thoracic Surgery (Cardiothoracic Vascular Surgery)

## 2021-11-11 VITALS — BP 124/88 | HR 55 | Temp 98.0°F | Resp 18 | Ht 67.5 in | Wt 174.1 lb

## 2021-11-11 DIAGNOSIS — I25118 Atherosclerotic heart disease of native coronary artery with other forms of angina pectoris: Secondary | ICD-10-CM

## 2021-11-11 DIAGNOSIS — I2510 Atherosclerotic heart disease of native coronary artery without angina pectoris: Secondary | ICD-10-CM | POA: Diagnosis not present

## 2021-11-11 DIAGNOSIS — Z01818 Encounter for other preprocedural examination: Secondary | ICD-10-CM

## 2021-11-11 HISTORY — DX: Atherosclerotic heart disease of native coronary artery without angina pectoris: I25.10

## 2021-11-11 HISTORY — DX: Essential (primary) hypertension: I10

## 2021-11-11 LAB — CBC
HCT: 46.7 % (ref 39.0–52.0)
Hemoglobin: 14.5 g/dL (ref 13.0–17.0)
MCH: 30.7 pg (ref 26.0–34.0)
MCHC: 31 g/dL (ref 30.0–36.0)
MCV: 98.7 fL (ref 80.0–100.0)
Platelets: 210 10*3/uL (ref 150–400)
RBC: 4.73 MIL/uL (ref 4.22–5.81)
RDW: 12.9 % (ref 11.5–15.5)
WBC: 5.7 10*3/uL (ref 4.0–10.5)
nRBC: 0 % (ref 0.0–0.2)

## 2021-11-11 LAB — URINALYSIS, ROUTINE W REFLEX MICROSCOPIC
Bilirubin Urine: NEGATIVE
Glucose, UA: NEGATIVE mg/dL
Hgb urine dipstick: NEGATIVE
Ketones, ur: NEGATIVE mg/dL
Leukocytes,Ua: NEGATIVE
Nitrite: NEGATIVE
Protein, ur: NEGATIVE mg/dL
Specific Gravity, Urine: 1.02 (ref 1.005–1.030)
pH: 6 (ref 5.0–8.0)

## 2021-11-11 LAB — HEMOGLOBIN A1C
Hgb A1c MFr Bld: 5.8 % — ABNORMAL HIGH (ref 4.8–5.6)
Mean Plasma Glucose: 119.76 mg/dL

## 2021-11-11 LAB — COMPREHENSIVE METABOLIC PANEL
ALT: 38 U/L (ref 0–44)
AST: 28 U/L (ref 15–41)
Albumin: 4.2 g/dL (ref 3.5–5.0)
Alkaline Phosphatase: 61 U/L (ref 38–126)
Anion gap: 9 (ref 5–15)
BUN: 16 mg/dL (ref 8–23)
CO2: 22 mmol/L (ref 22–32)
Calcium: 9.8 mg/dL (ref 8.9–10.3)
Chloride: 109 mmol/L (ref 98–111)
Creatinine, Ser: 0.84 mg/dL (ref 0.61–1.24)
GFR, Estimated: >60 mL/min (ref >60)
Glucose, Bld: 96 mg/dL (ref 70–99)
Potassium: 4.2 mmol/L (ref 3.5–5.1)
Sodium: 140 mmol/L (ref 135–145)
Total Bilirubin: 0.6 mg/dL (ref 0.3–1.2)
Total Protein: 7.6 g/dL (ref 6.5–8.1)

## 2021-11-11 LAB — APTT: aPTT: 30 seconds (ref 24–36)

## 2021-11-11 LAB — TYPE AND SCREEN
ABO/RH(D): A POS
Antibody Screen: NEGATIVE

## 2021-11-11 LAB — PROTIME-INR
INR: 1 (ref 0.8–1.2)
Prothrombin Time: 13.5 seconds (ref 11.4–15.2)

## 2021-11-11 LAB — SARS CORONAVIRUS 2 (TAT 6-24 HRS): SARS Coronavirus 2: NEGATIVE

## 2021-11-11 LAB — SURGICAL PCR SCREEN
MRSA, PCR: NEGATIVE
Staphylococcus aureus: POSITIVE — AB

## 2021-11-11 MED ORDER — DEXMEDETOMIDINE HCL IN NACL 400 MCG/100ML IV SOLN
0.1000 ug/kg/h | INTRAVENOUS | Status: AC
  Administered 2021-11-13: .3 ug/kg/h via INTRAVENOUS
  Filled 2021-11-11: qty 100

## 2021-11-11 MED ORDER — POTASSIUM CHLORIDE 2 MEQ/ML IV SOLN
80.0000 meq | INTRAVENOUS | Status: DC
  Filled 2021-11-11: qty 40

## 2021-11-11 MED ORDER — VANCOMYCIN HCL 1250 MG/250ML IV SOLN
1250.0000 mg | INTRAVENOUS | Status: AC
  Administered 2021-11-13: 1250 mg via INTRAVENOUS
  Filled 2021-11-11: qty 250

## 2021-11-11 MED ORDER — HEPARIN 30,000 UNITS/1000 ML (OHS) CELLSAVER SOLUTION
Status: DC
  Filled 2021-11-11: qty 1000

## 2021-11-11 MED ORDER — MILRINONE LACTATE IN DEXTROSE 20-5 MG/100ML-% IV SOLN
0.3000 ug/kg/min | INTRAVENOUS | Status: DC
  Filled 2021-11-11: qty 100

## 2021-11-11 MED ORDER — EPINEPHRINE HCL 5 MG/250ML IV SOLN IN NS
0.0000 ug/min | INTRAVENOUS | Status: DC
  Filled 2021-11-11: qty 250

## 2021-11-11 MED ORDER — MANNITOL 20 % IV SOLN
INTRAVENOUS | Status: DC
  Filled 2021-11-11: qty 13

## 2021-11-11 MED ORDER — TRANEXAMIC ACID (OHS) PUMP PRIME SOLUTION
2.0000 mg/kg | INTRAVENOUS | Status: DC
  Filled 2021-11-11: qty 1.58

## 2021-11-11 MED ORDER — NITROGLYCERIN IN D5W 200-5 MCG/ML-% IV SOLN
2.0000 ug/min | INTRAVENOUS | Status: DC
  Filled 2021-11-11: qty 250

## 2021-11-11 MED ORDER — NOREPINEPHRINE 4 MG/250ML-% IV SOLN
0.0000 ug/min | INTRAVENOUS | Status: DC
  Filled 2021-11-11: qty 250

## 2021-11-11 MED ORDER — PHENYLEPHRINE HCL-NACL 20-0.9 MG/250ML-% IV SOLN
30.0000 ug/min | INTRAVENOUS | Status: AC
  Administered 2021-11-13: 40 ug/min via INTRAVENOUS
  Filled 2021-11-11: qty 250

## 2021-11-11 MED ORDER — PLASMA-LYTE A IV SOLN
INTRAVENOUS | Status: DC
  Filled 2021-11-11: qty 2.5

## 2021-11-11 MED ORDER — TRANEXAMIC ACID (OHS) BOLUS VIA INFUSION
15.0000 mg/kg | INTRAVENOUS | Status: AC
  Administered 2021-11-13: 1185 mg via INTRAVENOUS
  Filled 2021-11-11: qty 1185

## 2021-11-11 MED ORDER — CEFAZOLIN SODIUM-DEXTROSE 2-4 GM/100ML-% IV SOLN
2.0000 g | INTRAVENOUS | Status: AC
  Administered 2021-11-13: 2 g via INTRAVENOUS

## 2021-11-11 MED ORDER — TRANEXAMIC ACID 1000 MG/10ML IV SOLN
1.5000 mg/kg/h | INTRAVENOUS | Status: AC
  Administered 2021-11-13: 1.5 mg/kg/h via INTRAVENOUS
  Filled 2021-11-11: qty 25

## 2021-11-11 MED ORDER — INSULIN REGULAR(HUMAN) IN NACL 100-0.9 UT/100ML-% IV SOLN
INTRAVENOUS | Status: AC
  Administered 2021-11-13: .5 [IU]/h via INTRAVENOUS
  Filled 2021-11-11: qty 100

## 2021-11-11 NOTE — Progress Notes (Signed)
PCP - Dr. Sallee Lange Cardiologist - Dr. Phineas Inches  Chest x-ray - 11/11/21 EKG - 10/30/21 Stress Test - 09/27/21 ECHO - 10/11/21 Cardiac Cath - 10/30/21  Aspirin Instructions: last dose to be 11/12/21  COVID TEST-  yes, done today 11/11/21  Anesthesia review: no  Patient denies shortness of breath, fever, cough and chest pain at PAT appointment   All instructions explained to the patient, with a verbal understanding of the material. Patient agrees to go over the instructions while at home for a better understanding. Patient also instructed to self quarantine after being tested for COVID-19. The opportunity to ask questions was provided.

## 2021-11-11 NOTE — Progress Notes (Signed)
Surgical Instructions   Your procedure is scheduled on Wednesday, November 13, 2021 at 8:30 AM.  Report to [facility] Main Entrance "A" at 6:30 AM, then check in with the Admitting office.  Call this number if you have problems the morning of surgery: [phone]   Remember:  Do not eat or drink after midnight the night before your surgery.  Take these medicines the morning of surgery with A SIP OF WATER: Metoprolol (Lopressor)  Continue taking Aspirin, but do not take day of surgery.  As of today, STOP Aleve, Naproxen, Ibuprofen, Motrin, Advil, Goody's, BC's, all herbal medications, fish oil, and all vitamins.        Do not wear jewelry. Do not wear lotions, powders, cologne or deodorant. Do not shave 48 hours prior to surgery.  Men may shave face and neck. Do not bring valuables to the hospital.  [facility] is not responsible for any belongings or valuables. .     Contacts, glasses, hearing aids, dentures or partials may not be worn into surgery, please bring cases for these belongings   For patients admitted to the hospital, discharge time will be determined by your treatment team.   [place] VISITATION Patients having surgery or a procedure in a hospital may have two support people. Children under the age of 36 must have an adult with them who is not the patient. They may stay in the waiting area during the procedure and may switch out with other visitors. If the patient needs to stay at the hospital during part of their recovery, the visitor guidelines for inpatient rooms apply.  Please refer to the [facility] website for the visitor guidelines for Inpatients (after your surgery is over and you are in a regular room).   Special instructions:    Oral Hygiene is also important to reduce your risk of infection.  Remember - BRUSH YOUR TEETH THE MORNING OF SURGERY WITH YOUR REGULAR TOOTHPASTE   [place]- Preparing For Surgery  Before surgery, you can play an  important role. Because skin is not sterile, your skin needs to be as free of germs as possible. You can reduce the number of germs on your skin by washing with CHG (chlorahexidine gluconate) Soap before surgery.  CHG is an antiseptic cleaner which kills germs and bonds with the skin to continue killing germs even after washing.     Please do not use if you have an allergy to CHG or antibacterial soaps. If your skin becomes reddened/irritated stop using the CHG.  Do not shave (including legs and underarms) for at least 48 hours prior to first CHG shower. It is OK to shave your face.  Please follow these instructions carefully.    Shower the NIGHT BEFORE SURGERY and the MORNING OF SURGERY with CHG Soap.   If you chose to wash your hair, wash your hair first as usual with your normal shampoo. After you shampoo, rinse your hair and body thoroughly to remove the shampoo.  Then [employer] and genitals (private parts) with your normal soap and rinse thoroughly to remove soap.  After that Use CHG Soap as you would any other liquid soap. You can apply CHG directly to the skin and wash gently with a scrungie or a clean washcloth.   Apply the CHG Soap to your body ONLY FROM THE NECK DOWN.  Do not use on open wounds or open sores. Avoid contact with your eyes, ears, mouth and genitals (private parts). Wash Face and  genitals (private parts)  with your normal soap.   Wash thoroughly, paying special attention to the area where your surgery will be performed.  Thoroughly rinse your body with warm water from the neck down.  DO NOT shower/wash with your normal soap after using and rinsing off the CHG Soap.  Pat yourself dry with a CLEAN TOWEL.  Wear CLEAN PAJAMAS to bed the night before surgery  Place CLEAN SHEETS on your bed the night before your surgery  DO NOT SLEEP WITH PETS.  Day of Surgery: Take a shower with CHG soap. Wear Clean/Comfortable clothing the morning of surgery Do not apply any  deodorants/lotions.   Remember to brush your teeth WITH YOUR REGULAR TOOTHPASTE.   If you received a COVID test during your pre-op visit, it is requested that you wear a mask when out in public, stay away from anyone that may not be feeling well, and notify your surgeon if you develop symptoms. If you have been in contact with anyone that has tested positive in the last 10 days, please notify your surgeon.    Please read over the fact sheets that you were given.

## 2021-11-11 NOTE — Progress Notes (Signed)
Pre-Op CABG study completed. Please see CV Proc for preliminary results.  Joachim Carton BS, RVT 11/11/2021 11:06 AM

## 2021-11-12 ENCOUNTER — Encounter (HOSPITAL_COMMUNITY): Payer: Self-pay | Admitting: Thoracic Surgery (Cardiothoracic Vascular Surgery)

## 2021-11-12 NOTE — Anesthesia Preprocedure Evaluation (Addendum)
Anesthesia Evaluation  Patient identified by MRN, date of birth, ID band Patient awake    Reviewed: Allergy & Precautions, NPO status , Patient's Chart, lab work & pertinent test results  Airway Mallampati: II  TM Distance: >3 FB Neck ROM: Full    Dental no notable dental hx.    Pulmonary neg pulmonary ROS, former smoker,    Pulmonary exam normal        Cardiovascular hypertension, Pt. on medications and Pt. on home beta blockers + angina + CAD   Rhythm:Regular Rate:Normal     Neuro/Psych negative neurological ROS  negative psych ROS   GI/Hepatic Neg liver ROS, GERD  ,  Endo/Other  negative endocrine ROS  Renal/GU negative Renal ROS  negative genitourinary   Musculoskeletal negative musculoskeletal ROS (+)   Abdominal Normal abdominal exam  (+)   Peds  Hematology negative hematology ROS (+)   Anesthesia Other Findings   Reproductive/Obstetrics                            Anesthesia Physical Anesthesia Plan  ASA: 4  Anesthesia Plan: General   Post-op Pain Management:    Induction: Intravenous  PONV Risk Score and Plan: 2 and Midazolam and Treatment may vary due to age or medical condition  Airway Management Planned: Mask and Oral ETT  Additional Equipment: Arterial line, CVP, TEE, 3D TEE and Ultrasound Guidance Line Placement  Intra-op Plan:   Post-operative Plan: Post-operative intubation/ventilation  Informed Consent: I have reviewed the patients History and Physical, chart, labs and discussed the procedure including the risks, benefits and alternatives for the proposed anesthesia with the patient or authorized representative who has indicated his/her understanding and acceptance.     Dental advisory given  Plan Discussed with: CRNA  Anesthesia Plan Comments: (Cath 06/23: 1.  Patent left main stem with no significant stenosis 2.  Severe 70 to 75% proximal LAD stenosis  with critical 95% first diagonal stenosis 3.  Moderate stenosis in the mid circumflex into the second OM and severe 90% stenosis of the first OM 4.  Nonobstructive RCA stenosis with severe 95% stenosis of the first posterolateral branch (largest of the distal RCA branch vessels)  ECHO 06/23: 1. Left ventricular ejection fraction, by estimation, is 60 to 65%. The  left ventricle has normal function. The left ventricle has no regional  wall motion abnormalities. There is mild left ventricular hypertrophy.  Left ventricular diastolic parameters  are consistent with Grade I diastolic dysfunction (impaired relaxation).  2. Right ventricular systolic function is normal. The right ventricular  size is normal. Tricuspid regurgitation signal is inadequate for assessing  PA pressure.  3. The mitral valve is normal in structure. No evidence of mitral valve  regurgitation. No evidence of mitral stenosis.  4. The aortic valve is tricuspid. Aortic valve regurgitation is trivial.  Aortic valve sclerosis is present, with no evidence of aortic valve  stenosis.  5. The inferior vena cava is normal in size with greater than 50%  respiratory variability, suggesting right atrial pressure of 3 mmHg. )       Anesthesia Quick Evaluation

## 2021-11-13 ENCOUNTER — Inpatient Hospital Stay (HOSPITAL_COMMUNITY): Payer: PRIVATE HEALTH INSURANCE

## 2021-11-13 ENCOUNTER — Other Ambulatory Visit: Payer: Self-pay

## 2021-11-13 ENCOUNTER — Inpatient Hospital Stay (HOSPITAL_COMMUNITY)
Admission: RE | Disposition: A | Discharge status: Home / Self Care | Payer: Self-pay | Source: Home / Self Care | Attending: Thoracic Surgery (Cardiothoracic Vascular Surgery)

## 2021-11-13 ENCOUNTER — Encounter (HOSPITAL_COMMUNITY): Payer: Self-pay | Admitting: Thoracic Surgery (Cardiothoracic Vascular Surgery)

## 2021-11-13 ENCOUNTER — Inpatient Hospital Stay (HOSPITAL_COMMUNITY): Payer: PRIVATE HEALTH INSURANCE | Admitting: Certified Registered Nurse Anesthetist

## 2021-11-13 ENCOUNTER — Inpatient Hospital Stay (HOSPITAL_COMMUNITY)
Admission: RE | Admit: 2021-11-13 | Discharge: 2021-11-17 | DRG: 236 | Disposition: A | Discharge status: Home / Self Care | Payer: PRIVATE HEALTH INSURANCE | Attending: Thoracic Surgery (Cardiothoracic Vascular Surgery) | Admitting: Thoracic Surgery (Cardiothoracic Vascular Surgery)

## 2021-11-13 ENCOUNTER — Inpatient Hospital Stay (HOSPITAL_COMMUNITY): Payer: PRIVATE HEALTH INSURANCE | Admitting: Vascular Surgery

## 2021-11-13 DIAGNOSIS — Z85828 Personal history of other malignant neoplasm of skin: Secondary | ICD-10-CM

## 2021-11-13 DIAGNOSIS — Z87891 Personal history of nicotine dependence: Secondary | ICD-10-CM

## 2021-11-13 DIAGNOSIS — Z8616 Personal history of COVID-19: Secondary | ICD-10-CM | POA: Diagnosis not present

## 2021-11-13 DIAGNOSIS — E785 Hyperlipidemia, unspecified: Secondary | ICD-10-CM | POA: Diagnosis present

## 2021-11-13 DIAGNOSIS — K219 Gastro-esophageal reflux disease without esophagitis: Secondary | ICD-10-CM | POA: Diagnosis present

## 2021-11-13 DIAGNOSIS — D696 Thrombocytopenia, unspecified: Secondary | ICD-10-CM | POA: Diagnosis not present

## 2021-11-13 DIAGNOSIS — R7303 Prediabetes: Secondary | ICD-10-CM | POA: Diagnosis present

## 2021-11-13 DIAGNOSIS — Z20822 Contact with and (suspected) exposure to covid-19: Secondary | ICD-10-CM | POA: Diagnosis present

## 2021-11-13 DIAGNOSIS — J9589 Other postprocedural complications and disorders of respiratory system, not elsewhere classified: Secondary | ICD-10-CM | POA: Diagnosis not present

## 2021-11-13 DIAGNOSIS — I10 Essential (primary) hypertension: Secondary | ICD-10-CM | POA: Diagnosis present

## 2021-11-13 DIAGNOSIS — D62 Acute posthemorrhagic anemia: Secondary | ICD-10-CM | POA: Diagnosis not present

## 2021-11-13 DIAGNOSIS — I2510 Atherosclerotic heart disease of native coronary artery without angina pectoris: Principal | ICD-10-CM | POA: Diagnosis present

## 2021-11-13 DIAGNOSIS — Z8249 Family history of ischemic heart disease and other diseases of the circulatory system: Secondary | ICD-10-CM | POA: Diagnosis not present

## 2021-11-13 DIAGNOSIS — Z79899 Other long term (current) drug therapy: Secondary | ICD-10-CM | POA: Diagnosis not present

## 2021-11-13 DIAGNOSIS — I25119 Atherosclerotic heart disease of native coronary artery with unspecified angina pectoris: Secondary | ICD-10-CM | POA: Diagnosis not present

## 2021-11-13 DIAGNOSIS — E875 Hyperkalemia: Secondary | ICD-10-CM | POA: Diagnosis not present

## 2021-11-13 DIAGNOSIS — Z01818 Encounter for other preprocedural examination: Secondary | ICD-10-CM

## 2021-11-13 DIAGNOSIS — I25118 Atherosclerotic heart disease of native coronary artery with other forms of angina pectoris: Secondary | ICD-10-CM

## 2021-11-13 DIAGNOSIS — Z951 Presence of aortocoronary bypass graft: Secondary | ICD-10-CM

## 2021-11-13 DIAGNOSIS — I493 Ventricular premature depolarization: Secondary | ICD-10-CM | POA: Diagnosis not present

## 2021-11-13 DIAGNOSIS — E8770 Fluid overload, unspecified: Secondary | ICD-10-CM | POA: Diagnosis not present

## 2021-11-13 DIAGNOSIS — Z7982 Long term (current) use of aspirin: Secondary | ICD-10-CM | POA: Diagnosis not present

## 2021-11-13 HISTORY — PX: CORONARY ARTERY BYPASS GRAFT: SHX141

## 2021-11-13 HISTORY — PX: TEE WITHOUT CARDIOVERSION: SHX5443

## 2021-11-13 LAB — PROTIME-INR
INR: 1.3 — ABNORMAL HIGH (ref 0.8–1.2)
Prothrombin Time: 16.2 seconds — ABNORMAL HIGH (ref 11.4–15.2)

## 2021-11-13 LAB — POCT I-STAT EG7
Acid-base deficit: 2 mmol/L (ref 0.0–2.0)
Bicarbonate: 23.2 mmol/L (ref 20.0–28.0)
Calcium, Ion: 1.05 mmol/L — ABNORMAL LOW (ref 1.15–1.40)
HCT: 26 % — ABNORMAL LOW (ref 39.0–52.0)
Hemoglobin: 8.8 g/dL — ABNORMAL LOW (ref 13.0–17.0)
O2 Saturation: 88 %
Potassium: 5.3 mmol/L — ABNORMAL HIGH (ref 3.5–5.1)
Sodium: 137 mmol/L (ref 135–145)
TCO2: 24 mmol/L (ref 22–32)
pCO2, Ven: 38.8 mmHg — ABNORMAL LOW (ref 44–60)
pH, Ven: 7.385 (ref 7.25–7.43)
pO2, Ven: 56 mmHg — ABNORMAL HIGH (ref 32–45)

## 2021-11-13 LAB — BASIC METABOLIC PANEL
Anion gap: 8 (ref 5–15)
Anion gap: 10 (ref 5–15)
BUN: 14 mg/dL (ref 8–23)
BUN: 15 mg/dL (ref 8–23)
CO2: 22 mmol/L (ref 22–32)
CO2: 20 mmol/L — ABNORMAL LOW (ref 22–32)
Calcium: 8.3 mg/dL — ABNORMAL LOW (ref 8.9–10.3)
Calcium: 8.4 mg/dL — ABNORMAL LOW (ref 8.9–10.3)
Chloride: 109 mmol/L (ref 98–111)
Chloride: 108 mmol/L (ref 98–111)
Creatinine, Ser: 0.79 mg/dL (ref 0.61–1.24)
Creatinine, Ser: 0.91 mg/dL (ref 0.61–1.24)
GFR, Estimated: >60 mL/min (ref >60)
GFR, Estimated: >60 mL/min (ref >60)
Glucose, Bld: 109 mg/dL — ABNORMAL HIGH (ref 70–99)
Glucose, Bld: 121 mg/dL — ABNORMAL HIGH (ref 70–99)
Potassium: 3.9 mmol/L (ref 3.5–5.1)
Potassium: 5.2 mmol/L — ABNORMAL HIGH (ref 3.5–5.1)
Sodium: 139 mmol/L (ref 135–145)
Sodium: 138 mmol/L (ref 135–145)

## 2021-11-13 LAB — POCT I-STAT, CHEM 8
BUN: 19 mg/dL (ref 8–23)
BUN: 18 mg/dL (ref 8–23)
BUN: 16 mg/dL (ref 8–23)
BUN: 17 mg/dL (ref 8–23)
Calcium, Ion: 1.29 mmol/L (ref 1.15–1.40)
Calcium, Ion: 1.2 mmol/L (ref 1.15–1.40)
Calcium, Ion: 1 mmol/L — ABNORMAL LOW (ref 1.15–1.40)
Calcium, Ion: 1.29 mmol/L (ref 1.15–1.40)
Chloride: 107 mmol/L (ref 98–111)
Chloride: 107 mmol/L (ref 98–111)
Chloride: 102 mmol/L (ref 98–111)
Chloride: 105 mmol/L (ref 98–111)
Creatinine, Ser: 0.8 mg/dL (ref 0.61–1.24)
Creatinine, Ser: 0.6 mg/dL — ABNORMAL LOW (ref 0.61–1.24)
Creatinine, Ser: 0.5 mg/dL — ABNORMAL LOW (ref 0.61–1.24)
Creatinine, Ser: 0.6 mg/dL — ABNORMAL LOW (ref 0.61–1.24)
Glucose, Bld: 94 mg/dL (ref 70–99)
Glucose, Bld: 96 mg/dL (ref 70–99)
Glucose, Bld: 103 mg/dL — ABNORMAL HIGH (ref 70–99)
Glucose, Bld: 106 mg/dL — ABNORMAL HIGH (ref 70–99)
HCT: 37 % — ABNORMAL LOW (ref 39.0–52.0)
HCT: 33 % — ABNORMAL LOW (ref 39.0–52.0)
HCT: 26 % — ABNORMAL LOW (ref 39.0–52.0)
HCT: 28 % — ABNORMAL LOW (ref 39.0–52.0)
Hemoglobin: 12.6 g/dL — ABNORMAL LOW (ref 13.0–17.0)
Hemoglobin: 11.2 g/dL — ABNORMAL LOW (ref 13.0–17.0)
Hemoglobin: 8.8 g/dL — ABNORMAL LOW (ref 13.0–17.0)
Hemoglobin: 9.5 g/dL — ABNORMAL LOW (ref 13.0–17.0)
Potassium: 4.3 mmol/L (ref 3.5–5.1)
Potassium: 4.2 mmol/L (ref 3.5–5.1)
Potassium: 5.5 mmol/L — ABNORMAL HIGH (ref 3.5–5.1)
Potassium: 4.8 mmol/L (ref 3.5–5.1)
Sodium: 141 mmol/L (ref 135–145)
Sodium: 141 mmol/L (ref 135–145)
Sodium: 136 mmol/L (ref 135–145)
Sodium: 138 mmol/L (ref 135–145)
TCO2: 24 mmol/L (ref 22–32)
TCO2: 22 mmol/L (ref 22–32)
TCO2: 22 mmol/L (ref 22–32)
TCO2: 22 mmol/L (ref 22–32)

## 2021-11-13 LAB — BLOOD GAS, ARTERIAL
Acid-base deficit: 1.1 mmol/L (ref 0.0–2.0)
Bicarbonate: 24.3 mmol/L (ref 20.0–28.0)
Drawn by: 30574
O2 Saturation: 98.4 %
Patient temperature: 37
pCO2 arterial: 42 mmHg (ref 32–48)
pH, Arterial: 7.37 (ref 7.35–7.45)
pO2, Arterial: 97 mmHg (ref 83–108)

## 2021-11-13 LAB — POCT I-STAT 7, (LYTES, BLD GAS, ICA,H+H)
Acid-Base Excess: 1 mmol/L (ref 0.0–2.0)
Acid-base deficit: 4 mmol/L — ABNORMAL HIGH (ref 0.0–2.0)
Acid-base deficit: 4 mmol/L — ABNORMAL HIGH (ref 0.0–2.0)
Acid-base deficit: 6 mmol/L — ABNORMAL HIGH (ref 0.0–2.0)
Acid-base deficit: 3 mmol/L — ABNORMAL HIGH (ref 0.0–2.0)
Acid-base deficit: 6 mmol/L — ABNORMAL HIGH (ref 0.0–2.0)
Bicarbonate: 25.3 mmol/L (ref 20.0–28.0)
Bicarbonate: 21.6 mmol/L (ref 20.0–28.0)
Bicarbonate: 22.2 mmol/L (ref 20.0–28.0)
Bicarbonate: 20.7 mmol/L (ref 20.0–28.0)
Bicarbonate: 18.6 mmol/L — ABNORMAL LOW (ref 20.0–28.0)
Bicarbonate: 22.8 mmol/L (ref 20.0–28.0)
Calcium, Ion: 1.04 mmol/L — ABNORMAL LOW (ref 1.15–1.40)
Calcium, Ion: 1.31 mmol/L (ref 1.15–1.40)
Calcium, Ion: 1.19 mmol/L (ref 1.15–1.40)
Calcium, Ion: 1.25 mmol/L (ref 1.15–1.40)
Calcium, Ion: 1.01 mmol/L — ABNORMAL LOW (ref 1.15–1.40)
Calcium, Ion: 1.15 mmol/L (ref 1.15–1.40)
HCT: 26 % — ABNORMAL LOW (ref 39.0–52.0)
HCT: 28 % — ABNORMAL LOW (ref 39.0–52.0)
HCT: 34 % — ABNORMAL LOW (ref 39.0–52.0)
HCT: 36 % — ABNORMAL LOW (ref 39.0–52.0)
HCT: 25 % — ABNORMAL LOW (ref 39.0–52.0)
HCT: 31 % — ABNORMAL LOW (ref 39.0–52.0)
Hemoglobin: 8.8 g/dL — ABNORMAL LOW (ref 13.0–17.0)
Hemoglobin: 9.5 g/dL — ABNORMAL LOW (ref 13.0–17.0)
Hemoglobin: 11.6 g/dL — ABNORMAL LOW (ref 13.0–17.0)
Hemoglobin: 12.2 g/dL — ABNORMAL LOW (ref 13.0–17.0)
Hemoglobin: 10.5 g/dL — ABNORMAL LOW (ref 13.0–17.0)
Hemoglobin: 8.5 g/dL — ABNORMAL LOW (ref 13.0–17.0)
O2 Saturation: 100 %
O2 Saturation: 99 %
O2 Saturation: 98 %
O2 Saturation: 93 %
O2 Saturation: 95 %
O2 Saturation: 96 %
Patient temperature: 36.1
Patient temperature: 36.8
Patient temperature: 37.4
Patient temperature: 37.7
Patient temperature: 37.7
Potassium: 4.4 mmol/L (ref 3.5–5.1)
Potassium: 3.7 mmol/L (ref 3.5–5.1)
Potassium: 4.4 mmol/L (ref 3.5–5.1)
Potassium: 5.2 mmol/L — ABNORMAL HIGH (ref 3.5–5.1)
Potassium: 3.3 mmol/L — ABNORMAL LOW (ref 3.5–5.1)
Potassium: 4.1 mmol/L (ref 3.5–5.1)
Sodium: 138 mmol/L (ref 135–145)
Sodium: 141 mmol/L (ref 135–145)
Sodium: 141 mmol/L (ref 135–145)
Sodium: 140 mmol/L (ref 135–145)
Sodium: 142 mmol/L (ref 135–145)
Sodium: 145 mmol/L (ref 135–145)
TCO2: 27 mmol/L (ref 22–32)
TCO2: 23 mmol/L (ref 22–32)
TCO2: 23 mmol/L (ref 22–32)
TCO2: 22 mmol/L (ref 22–32)
TCO2: 20 mmol/L — ABNORMAL LOW (ref 22–32)
TCO2: 24 mmol/L (ref 22–32)
pCO2 arterial: 40.6 mmHg (ref 32–48)
pCO2 arterial: 37.7 mmHg (ref 32–48)
pCO2 arterial: 43.6 mmHg (ref 32–48)
pCO2 arterial: 45 mmHg (ref 32–48)
pCO2 arterial: 34.1 mmHg (ref 32–48)
pCO2 arterial: 41.7 mmHg (ref 32–48)
pH, Arterial: 7.402 (ref 7.35–7.45)
pH, Arterial: 7.361 (ref 7.35–7.45)
pH, Arterial: 7.314 — ABNORMAL LOW (ref 7.35–7.45)
pH, Arterial: 7.271 — ABNORMAL LOW (ref 7.35–7.45)
pH, Arterial: 7.348 — ABNORMAL LOW (ref 7.35–7.45)
pH, Arterial: 7.349 — ABNORMAL LOW (ref 7.35–7.45)
pO2, Arterial: 337 mmHg — ABNORMAL HIGH (ref 83–108)
pO2, Arterial: 125 mmHg — ABNORMAL HIGH (ref 83–108)
pO2, Arterial: 112 mmHg — ABNORMAL HIGH (ref 83–108)
pO2, Arterial: 78 mmHg — ABNORMAL LOW (ref 83–108)
pO2, Arterial: 84 mmHg (ref 83–108)
pO2, Arterial: 87 mmHg (ref 83–108)

## 2021-11-13 LAB — CBC
HCT: 31 % — ABNORMAL LOW (ref 39.0–52.0)
HCT: 33.9 % — ABNORMAL LOW (ref 39.0–52.0)
Hemoglobin: 9.9 g/dL — ABNORMAL LOW (ref 13.0–17.0)
Hemoglobin: 10.9 g/dL — ABNORMAL LOW (ref 13.0–17.0)
MCH: 31.3 pg (ref 26.0–34.0)
MCH: 31.7 pg (ref 26.0–34.0)
MCHC: 31.9 g/dL (ref 30.0–36.0)
MCHC: 32.2 g/dL (ref 30.0–36.0)
MCV: 98.1 fL (ref 80.0–100.0)
MCV: 98.5 fL (ref 80.0–100.0)
Platelets: 124 10*3/uL — ABNORMAL LOW (ref 150–400)
Platelets: 146 10*3/uL — ABNORMAL LOW (ref 150–400)
RBC: 3.16 MIL/uL — ABNORMAL LOW (ref 4.22–5.81)
RBC: 3.44 MIL/uL — ABNORMAL LOW (ref 4.22–5.81)
RDW: 12.6 % (ref 11.5–15.5)
RDW: 12.8 % (ref 11.5–15.5)
WBC: 10.3 10*3/uL (ref 4.0–10.5)
WBC: 10.6 10*3/uL — ABNORMAL HIGH (ref 4.0–10.5)
nRBC: 0 % (ref 0.0–0.2)
nRBC: 0 % (ref 0.0–0.2)

## 2021-11-13 LAB — HEMOGLOBIN AND HEMATOCRIT, BLOOD
HCT: 26.5 % — ABNORMAL LOW (ref 39.0–52.0)
Hemoglobin: 9.1 g/dL — ABNORMAL LOW (ref 13.0–17.0)

## 2021-11-13 LAB — GLUCOSE, CAPILLARY
Glucose-Capillary: 110 mg/dL — ABNORMAL HIGH (ref 70–99)
Glucose-Capillary: 112 mg/dL — ABNORMAL HIGH (ref 70–99)
Glucose-Capillary: 114 mg/dL — ABNORMAL HIGH (ref 70–99)
Glucose-Capillary: 99 mg/dL (ref 70–99)
Glucose-Capillary: 108 mg/dL — ABNORMAL HIGH (ref 70–99)
Glucose-Capillary: 109 mg/dL — ABNORMAL HIGH (ref 70–99)
Glucose-Capillary: 119 mg/dL — ABNORMAL HIGH (ref 70–99)
Glucose-Capillary: 117 mg/dL — ABNORMAL HIGH (ref 70–99)
Glucose-Capillary: 115 mg/dL — ABNORMAL HIGH (ref 70–99)
Glucose-Capillary: 113 mg/dL — ABNORMAL HIGH (ref 70–99)

## 2021-11-13 LAB — PLATELET COUNT: Platelets: 131 10*3/uL — ABNORMAL LOW (ref 150–400)

## 2021-11-13 LAB — ABO/RH: ABO/RH(D): A POS

## 2021-11-13 LAB — APTT: aPTT: 36 seconds (ref 24–36)

## 2021-11-13 LAB — MAGNESIUM: Magnesium: 2.7 mg/dL — ABNORMAL HIGH (ref 1.7–2.4)

## 2021-11-13 SURGERY — CORONARY ARTERY BYPASS GRAFTING (CABG)
Anesthesia: General | Site: Esophagus

## 2021-11-13 MED ORDER — BISACODYL 10 MG RE SUPP: 10.0000 mg | Freq: Every day | RECTAL | Status: DC

## 2021-11-13 MED ORDER — ROCURONIUM BROMIDE 10 MG/ML (PF) SYRINGE
PREFILLED_SYRINGE | INTRAVENOUS | Status: DC | PRN
  Administered 2021-11-13: 30 mg via INTRAVENOUS
  Administered 2021-11-13: 70 mg via INTRAVENOUS
  Administered 2021-11-13: 20 mg via INTRAVENOUS
  Administered 2021-11-13 (×2): 50 mg via INTRAVENOUS

## 2021-11-13 MED ORDER — PROTAMINE SULFATE 10 MG/ML IV SOLN
INTRAVENOUS | Status: AC
  Filled 2021-11-13: qty 25

## 2021-11-13 MED ORDER — ALBUMIN HUMAN 5 % IV SOLN: INTRAVENOUS | Status: DC | PRN

## 2021-11-13 MED ORDER — 0.9 % SODIUM CHLORIDE (POUR BTL) OPTIME
TOPICAL | Status: DC | PRN
  Administered 2021-11-13: 5000 mL

## 2021-11-13 MED ORDER — DOCUSATE SODIUM 100 MG PO CAPS
200.0000 mg | ORAL_CAPSULE | Freq: Every day | ORAL | Status: DC
  Administered 2021-11-14 – 2021-11-15 (×2): 200 mg via ORAL
  Filled 2021-11-13 (×2): qty 2

## 2021-11-13 MED ORDER — METOCLOPRAMIDE HCL 5 MG/ML IJ SOLN
10.0000 mg | Freq: Four times a day (QID) | INTRAMUSCULAR | Status: AC
  Administered 2021-11-13 – 2021-11-14 (×3): 10 mg via INTRAVENOUS
  Filled 2021-11-13 (×3): qty 2

## 2021-11-13 MED ORDER — SODIUM CHLORIDE 0.9% FLUSH: 3.0000 mL | INTRAVENOUS | Status: DC | PRN

## 2021-11-13 MED ORDER — INSULIN REGULAR(HUMAN) IN NACL 100-0.9 UT/100ML-% IV SOLN: INTRAVENOUS | Status: DC

## 2021-11-13 MED ORDER — CHLORHEXIDINE GLUCONATE 4 % EX LIQD: 30.0000 mL | CUTANEOUS | Status: DC

## 2021-11-13 MED ORDER — LACTATED RINGERS IV SOLN: INTRAVENOUS | Status: DC

## 2021-11-13 MED ORDER — FENTANYL CITRATE (PF) 250 MCG/5ML IJ SOLN
INTRAMUSCULAR | Status: AC
  Filled 2021-11-13: qty 5

## 2021-11-13 MED ORDER — LACTATED RINGERS IV SOLN: INTRAVENOUS | Status: DC | PRN

## 2021-11-13 MED ORDER — SODIUM CHLORIDE 0.9% FLUSH
3.0000 mL | Freq: Two times a day (BID) | INTRAVENOUS | Status: DC
  Administered 2021-11-14 – 2021-11-15 (×3): 3 mL via INTRAVENOUS

## 2021-11-13 MED ORDER — PHENYLEPHRINE HCL-NACL 20-0.9 MG/250ML-% IV SOLN
0.0000 ug/min | INTRAVENOUS | Status: DC
  Administered 2021-11-14 (×2): 60 ug/min via INTRAVENOUS
  Filled 2021-11-13 (×2): qty 250

## 2021-11-13 MED ORDER — SODIUM BICARBONATE 8.4 % IV SOLN
100.0000 meq | Freq: Once | INTRAVENOUS | Status: AC
  Administered 2021-11-13: 100 meq via INTRAVENOUS

## 2021-11-13 MED ORDER — POTASSIUM CHLORIDE 10 MEQ/50ML IV SOLN
10.0000 meq | INTRAVENOUS | Status: AC
  Administered 2021-11-13 (×3): 10 meq via INTRAVENOUS

## 2021-11-13 MED ORDER — SODIUM CHLORIDE 0.9% FLUSH: 10.0000 mL | INTRAVENOUS | Status: DC | PRN

## 2021-11-13 MED ORDER — ORAL CARE MOUTH RINSE: 15.0000 mL | OROMUCOSAL | Status: DC | PRN

## 2021-11-13 MED ORDER — PANTOPRAZOLE SODIUM 40 MG PO TBEC
40.0000 mg | DELAYED_RELEASE_TABLET | Freq: Every day | ORAL | Status: DC
  Administered 2021-11-15 – 2021-11-17 (×3): 40 mg via ORAL
  Filled 2021-11-13 (×3): qty 1

## 2021-11-13 MED ORDER — DEXTROSE 50 % IV SOLN
0.0000 mL | INTRAVENOUS | Status: DC | PRN
  Administered 2021-11-14: 30 mL via INTRAVENOUS
  Filled 2021-11-13: qty 50

## 2021-11-13 MED ORDER — ROCURONIUM BROMIDE 10 MG/ML (PF) SYRINGE
PREFILLED_SYRINGE | INTRAVENOUS | Status: AC
Start: 2021-11-13 — End: ?
  Filled 2021-11-13: qty 10

## 2021-11-13 MED ORDER — ALBUTEROL SULFATE (2.5 MG/3ML) 0.083% IN NEBU
2.5000 mg | INHALATION_SOLUTION | RESPIRATORY_TRACT | Status: DC | PRN
Start: 2021-11-13 — End: 2021-11-17

## 2021-11-13 MED ORDER — SODIUM CHLORIDE 0.45 % IV SOLN: INTRAVENOUS | Status: DC | PRN

## 2021-11-13 MED ORDER — ACETAMINOPHEN 160 MG/5ML PO SOLN: 1000.0000 mg | Freq: Four times a day (QID) | ORAL | Status: DC

## 2021-11-13 MED ORDER — ASPIRIN 81 MG PO CHEW
324.0000 mg | CHEWABLE_TABLET | Freq: Every day | ORAL | Status: DC
  Filled 2021-11-13: qty 4

## 2021-11-13 MED ORDER — SODIUM CHLORIDE 0.9 % IV SOLN: 250.0000 mL | INTRAVENOUS | Status: DC

## 2021-11-13 MED ORDER — CHLORHEXIDINE GLUCONATE CLOTH 2 % EX PADS
6.0000 | MEDICATED_PAD | Freq: Every day | CUTANEOUS | Status: DC
Start: 2021-11-13 — End: 2021-11-16
  Administered 2021-11-13 – 2021-11-15 (×3): 6 via TOPICAL

## 2021-11-13 MED ORDER — LACTATED RINGERS IV SOLN
INTRAVENOUS | Status: DC
Start: 2021-11-13 — End: 2021-11-16

## 2021-11-13 MED ORDER — MILRINONE LACTATE IN DEXTROSE 20-5 MG/100ML-% IV SOLN: 0.3000 ug/kg/min | INTRAVENOUS | Status: DC

## 2021-11-13 MED ORDER — ACETAMINOPHEN 160 MG/5ML PO SOLN: 650.0000 mg | Freq: Once | ORAL | Status: AC

## 2021-11-13 MED ORDER — PROTAMINE SULFATE 10 MG/ML IV SOLN
INTRAVENOUS | Status: DC | PRN
  Administered 2021-11-13: 10 mg via INTRAVENOUS
  Administered 2021-11-13: 230 mg via INTRAVENOUS

## 2021-11-13 MED ORDER — ALBUMIN HUMAN 5 % IV SOLN
250.0000 mL | INTRAVENOUS | Status: AC | PRN
  Administered 2021-11-13 (×2): 12.5 g via INTRAVENOUS

## 2021-11-13 MED ORDER — FAMOTIDINE 20 MG PO TABS
20.0000 mg | ORAL_TABLET | Freq: Two times a day (BID) | ORAL | Status: DC
  Administered 2021-11-14 – 2021-11-17 (×7): 20 mg via ORAL
  Filled 2021-11-13 (×7): qty 1

## 2021-11-13 MED ORDER — ~~LOC~~ CARDIAC SURGERY, PATIENT & FAMILY EDUCATION
Freq: Once | Status: DC
  Administered 2021-11-13: 1
  Filled 2021-11-13: qty 1

## 2021-11-13 MED ORDER — FENTANYL CITRATE (PF) 250 MCG/5ML IJ SOLN
INTRAMUSCULAR | Status: DC | PRN
  Administered 2021-11-13: 150 ug via INTRAVENOUS
  Administered 2021-11-13: 100 ug via INTRAVENOUS
  Administered 2021-11-13 (×4): 50 ug via INTRAVENOUS
  Administered 2021-11-13 (×2): 100 ug via INTRAVENOUS
  Administered 2021-11-13: 50 ug via INTRAVENOUS
  Administered 2021-11-13: 100 ug via INTRAVENOUS
  Administered 2021-11-13 (×4): 50 ug via INTRAVENOUS

## 2021-11-13 MED ORDER — METOPROLOL TARTRATE 12.5 MG HALF TABLET
12.5000 mg | ORAL_TABLET | Freq: Two times a day (BID) | ORAL | Status: DC
Start: 2021-11-13 — End: 2021-11-16
  Administered 2021-11-15 (×2): 12.5 mg via ORAL
  Filled 2021-11-13 (×2): qty 1

## 2021-11-13 MED ORDER — ORAL CARE MOUTH RINSE
15.0000 mL | Freq: Two times a day (BID) | OROMUCOSAL | Status: DC
  Administered 2021-11-14: 15 mL via OROMUCOSAL

## 2021-11-13 MED ORDER — PHENYLEPHRINE 80 MCG/ML (10ML) SYRINGE FOR IV PUSH (FOR BLOOD PRESSURE SUPPORT)
PREFILLED_SYRINGE | INTRAVENOUS | Status: AC
  Filled 2021-11-13: qty 10

## 2021-11-13 MED ORDER — CHLORHEXIDINE GLUCONATE 0.12 % MT SOLN
15.0000 mL | OROMUCOSAL | Status: AC
  Administered 2021-11-13: 15 mL via OROMUCOSAL

## 2021-11-13 MED ORDER — MIDAZOLAM HCL 5 MG/5ML IJ SOLN
INTRAMUSCULAR | Status: DC | PRN
  Administered 2021-11-13: 2 mg via INTRAVENOUS
  Administered 2021-11-13 (×2): 1 mg via INTRAVENOUS

## 2021-11-13 MED ORDER — METOPROLOL TARTRATE 12.5 MG HALF TABLET
12.5000 mg | ORAL_TABLET | Freq: Once | ORAL | Status: DC
  Filled 2021-11-13: qty 1

## 2021-11-13 MED ORDER — TRAMADOL HCL 50 MG PO TABS
50.0000 mg | ORAL_TABLET | ORAL | Status: DC | PRN
  Administered 2021-11-14: 100 mg via ORAL
  Filled 2021-11-13 (×2): qty 2

## 2021-11-13 MED ORDER — METOPROLOL TARTRATE 25 MG/10 ML ORAL SUSPENSION
12.5000 mg | Freq: Two times a day (BID) | ORAL | Status: DC
Start: 2021-11-13 — End: 2021-11-16
  Filled 2021-11-13 (×2): qty 5

## 2021-11-13 MED ORDER — OXYCODONE HCL 5 MG PO TABS
5.0000 mg | ORAL_TABLET | ORAL | Status: DC | PRN
  Administered 2021-11-13: 5 mg via ORAL
  Administered 2021-11-14 (×2): 10 mg via ORAL
  Filled 2021-11-13: qty 2
  Filled 2021-11-13: qty 1
  Filled 2021-11-13 (×2): qty 2

## 2021-11-13 MED ORDER — CHLORHEXIDINE GLUCONATE 0.12 % MT SOLN
15.0000 mL | Freq: Once | OROMUCOSAL | Status: AC
  Administered 2021-11-13: 15 mL via OROMUCOSAL
  Filled 2021-11-13: qty 15

## 2021-11-13 MED ORDER — VANCOMYCIN HCL IN DEXTROSE 1-5 GM/200ML-% IV SOLN
1000.0000 mg | Freq: Once | INTRAVENOUS | Status: AC
  Administered 2021-11-13: 1000 mg via INTRAVENOUS
  Filled 2021-11-13: qty 200

## 2021-11-13 MED ORDER — BISACODYL 5 MG PO TBEC
10.0000 mg | DELAYED_RELEASE_TABLET | Freq: Every day | ORAL | Status: DC
  Administered 2021-11-14 – 2021-11-15 (×2): 10 mg via ORAL
  Filled 2021-11-13 (×2): qty 2

## 2021-11-13 MED ORDER — HEPARIN SODIUM (PORCINE) 1000 UNIT/ML IJ SOLN
INTRAMUSCULAR | Status: DC | PRN
  Administered 2021-11-13: 25000 [IU] via INTRAVENOUS

## 2021-11-13 MED ORDER — FAMOTIDINE IN NACL 20-0.9 MG/50ML-% IV SOLN
20.0000 mg | Freq: Two times a day (BID) | INTRAVENOUS | Status: AC
  Administered 2021-11-13: 20 mg via INTRAVENOUS
  Filled 2021-11-13: qty 50

## 2021-11-13 MED ORDER — ACETAMINOPHEN 650 MG RE SUPP
650.0000 mg | Freq: Once | RECTAL | Status: AC
  Administered 2021-11-13: 650 mg via RECTAL

## 2021-11-13 MED ORDER — MIDAZOLAM HCL 2 MG/2ML IJ SOLN: 2.0000 mg | INTRAMUSCULAR | Status: DC | PRN

## 2021-11-13 MED ORDER — SODIUM CHLORIDE 0.9 % IV SOLN: INTRAVENOUS | Status: DC

## 2021-11-13 MED ORDER — ROSUVASTATIN CALCIUM 20 MG PO TABS
40.0000 mg | ORAL_TABLET | Freq: Every day | ORAL | Status: DC
  Administered 2021-11-14 – 2021-11-17 (×4): 40 mg via ORAL
  Filled 2021-11-13 (×4): qty 2

## 2021-11-13 MED ORDER — CEFAZOLIN SODIUM-DEXTROSE 2-4 GM/100ML-% IV SOLN
2.0000 g | Freq: Three times a day (TID) | INTRAVENOUS | Status: AC
  Administered 2021-11-13 – 2021-11-15 (×6): 2 g via INTRAVENOUS
  Filled 2021-11-13 (×6): qty 100

## 2021-11-13 MED ORDER — EPINEPHRINE HCL 5 MG/250ML IV SOLN IN NS: 0.0000 ug/min | INTRAVENOUS | Status: DC

## 2021-11-13 MED ORDER — PROTAMINE SULFATE 10 MG/ML IV SOLN
INTRAVENOUS | Status: AC
  Filled 2021-11-13: qty 5

## 2021-11-13 MED ORDER — PLASMA-LYTE A IV SOLN: INTRAVENOUS | Status: DC | PRN

## 2021-11-13 MED ORDER — ROCURONIUM BROMIDE 10 MG/ML (PF) SYRINGE
PREFILLED_SYRINGE | INTRAVENOUS | Status: AC
  Filled 2021-11-13: qty 20

## 2021-11-13 MED ORDER — NICARDIPINE HCL IN NACL 20-0.86 MG/200ML-% IV SOLN: 3.0000 mg/h | INTRAVENOUS | Status: DC

## 2021-11-13 MED ORDER — HEMOSTATIC AGENTS (NO CHARGE) OPTIME
TOPICAL | Status: DC | PRN
  Administered 2021-11-13: 1 via TOPICAL

## 2021-11-13 MED ORDER — MIDAZOLAM HCL (PF) 10 MG/2ML IJ SOLN
INTRAMUSCULAR | Status: AC
  Filled 2021-11-13: qty 2

## 2021-11-13 MED ORDER — PHENYLEPHRINE HCL (PRESSORS) 10 MG/ML IV SOLN: INTRAVENOUS | Status: DC | PRN

## 2021-11-13 MED ORDER — SODIUM CHLORIDE (PF) 0.9 % IJ SOLN
OROMUCOSAL | Status: DC | PRN
  Administered 2021-11-13: 8 mL via TOPICAL

## 2021-11-13 MED ORDER — MORPHINE SULFATE (PF) 2 MG/ML IV SOLN
1.0000 mg | INTRAVENOUS | Status: DC | PRN
  Administered 2021-11-13 – 2021-11-16 (×13): 2 mg via INTRAVENOUS
  Filled 2021-11-13 (×13): qty 1

## 2021-11-13 MED ORDER — LACTATED RINGERS IV SOLN: 500.0000 mL | Freq: Once | INTRAVENOUS | Status: DC | PRN

## 2021-11-13 MED ORDER — CALCIUM CHLORIDE 10 % IV SOLN
INTRAVENOUS | Status: DC | PRN
  Administered 2021-11-13: 300 mg via INTRAVENOUS
  Administered 2021-11-13: 200 mg via INTRAVENOUS

## 2021-11-13 MED ORDER — PHENYLEPHRINE 80 MCG/ML (10ML) SYRINGE FOR IV PUSH (FOR BLOOD PRESSURE SUPPORT)
PREFILLED_SYRINGE | INTRAVENOUS | Status: DC | PRN
  Administered 2021-11-13: 40 ug via INTRAVENOUS
  Administered 2021-11-13: 80 ug via INTRAVENOUS
  Administered 2021-11-13 (×2): 40 ug via INTRAVENOUS

## 2021-11-13 MED ORDER — HEPARIN SODIUM (PORCINE) 1000 UNIT/ML IJ SOLN
INTRAMUSCULAR | Status: AC
  Filled 2021-11-13: qty 1

## 2021-11-13 MED ORDER — ORAL CARE MOUTH RINSE
15.0000 mL | OROMUCOSAL | Status: DC
  Administered 2021-11-13: 15 mL via OROMUCOSAL

## 2021-11-13 MED ORDER — PROPOFOL 10 MG/ML IV BOLUS
INTRAVENOUS | Status: AC
  Filled 2021-11-13: qty 20

## 2021-11-13 MED ORDER — PROPOFOL 10 MG/ML IV BOLUS
INTRAVENOUS | Status: DC | PRN
  Administered 2021-11-13: 100 mg via INTRAVENOUS

## 2021-11-13 MED ORDER — MAGNESIUM SULFATE 4 GM/100ML IV SOLN
4.0000 g | Freq: Once | INTRAVENOUS | Status: AC
  Administered 2021-11-13: 4 g via INTRAVENOUS
  Filled 2021-11-13: qty 100

## 2021-11-13 MED ORDER — DEXMEDETOMIDINE HCL IN NACL 400 MCG/100ML IV SOLN
0.0000 ug/kg/h | INTRAVENOUS | Status: DC
  Administered 2021-11-13: 0.7 ug/kg/h via INTRAVENOUS
  Filled 2021-11-13: qty 100

## 2021-11-13 MED ORDER — ONDANSETRON HCL 4 MG/2ML IJ SOLN
4.0000 mg | Freq: Four times a day (QID) | INTRAMUSCULAR | Status: DC | PRN
  Administered 2021-11-14 – 2021-11-16 (×2): 4 mg via INTRAVENOUS
  Filled 2021-11-13 (×2): qty 2

## 2021-11-13 MED ORDER — SODIUM CHLORIDE 0.9% FLUSH
10.0000 mL | Freq: Two times a day (BID) | INTRAVENOUS | Status: DC
  Administered 2021-11-13 – 2021-11-15 (×4): 10 mL

## 2021-11-13 MED ORDER — ASPIRIN 325 MG PO TBEC
325.0000 mg | DELAYED_RELEASE_TABLET | Freq: Every day | ORAL | Status: DC
  Administered 2021-11-14 – 2021-11-17 (×4): 325 mg via ORAL
  Filled 2021-11-13 (×4): qty 1

## 2021-11-13 MED ORDER — NITROGLYCERIN IN D5W 200-5 MCG/ML-% IV SOLN: 0.0000 ug/min | INTRAVENOUS | Status: DC

## 2021-11-13 MED ORDER — NOREPINEPHRINE 4 MG/250ML-% IV SOLN: 0.0000 ug/min | INTRAVENOUS | Status: DC

## 2021-11-13 MED ORDER — METOPROLOL TARTRATE 5 MG/5ML IV SOLN: 2.5000 mg | INTRAVENOUS | Status: DC | PRN

## 2021-11-13 MED ORDER — ACETAMINOPHEN 500 MG PO TABS
1000.0000 mg | ORAL_TABLET | Freq: Four times a day (QID) | ORAL | Status: DC
  Administered 2021-11-13 – 2021-11-17 (×14): 1000 mg via ORAL
  Filled 2021-11-13 (×14): qty 2

## 2021-11-13 SURGICAL SUPPLY — 97 items
BAG DECANTER FOR FLEXI CONT (MISCELLANEOUS) ×3 IMPLANT
BLADE CLIPPER SURG (BLADE) ×3 IMPLANT
BLADE STERNUM SYSTEM 6 (BLADE) ×3 IMPLANT
BNDG ELASTIC 4X5.8 VLCR STR LF (GAUZE/BANDAGES/DRESSINGS) ×3 IMPLANT
BNDG ELASTIC 6X5.8 VLCR STR LF (GAUZE/BANDAGES/DRESSINGS) ×3 IMPLANT
BNDG GAUZE DERMACEA FLUFF (GAUZE/BANDAGES/DRESSINGS) ×3
BNDG GAUZE DERMACEA FLUFF 4 (GAUZE/BANDAGES/DRESSINGS) IMPLANT
BNDG GAUZE ELAST 4 BULKY (GAUZE/BANDAGES/DRESSINGS) ×3 IMPLANT
CABLE SURGICAL S-101-97-12 (CABLE) ×3 IMPLANT
CANISTER SUCT 3000ML PPV (MISCELLANEOUS) ×3 IMPLANT
CANNULA MC2 2 STG 29/37 NON-V (CANNULA) ×2 IMPLANT
CANNULA MC2 TWO STAGE (CANNULA)
CANNULA NON VENT 20FR 12 (CANNULA) ×3 IMPLANT
CATH ROBINSON RED A/P 18FR (CATHETERS) ×6 IMPLANT
CLIP RETRACTION 3.0MM CORONARY (MISCELLANEOUS) ×3 IMPLANT
CLIP TI MEDIUM 24 (CLIP) ×1 IMPLANT
CLIP VESOCCLUDE MED 24/CT (CLIP) IMPLANT
CLIP VESOCCLUDE SM WIDE 24/CT (CLIP) IMPLANT
CONN ST 1/2X1/2  BEN (MISCELLANEOUS) ×3
CONN ST 1/2X1/2 BEN (MISCELLANEOUS) ×2 IMPLANT
CONNECTOR BLAKE 2:1 CARIO BLK (MISCELLANEOUS) ×3 IMPLANT
CONTAINER PROTECT SURGISLUSH (MISCELLANEOUS) ×6 IMPLANT
DEFOGGER ANTIFOG KIT (MISCELLANEOUS) ×1 IMPLANT
DERMABOND ADVANCED (GAUZE/BANDAGES/DRESSINGS) ×3
DERMABOND ADVANCED .7 DNX12 (GAUZE/BANDAGES/DRESSINGS) IMPLANT
DRAIN CHANNEL 19F RND (DRAIN) ×9 IMPLANT
DRAIN CONNECTOR BLAKE 1:1 (MISCELLANEOUS) ×3 IMPLANT
DRAPE CARDIOVASCULAR INCISE (DRAPES) ×3
DRAPE INCISE IOBAN 66X45 STRL (DRAPES) IMPLANT
DRAPE SRG 135X102X78XABS (DRAPES) ×2 IMPLANT
DRAPE WARM FLUID 44X44 (DRAPES) ×3 IMPLANT
DRSG AQUACEL AG ADV 3.5X10 (GAUZE/BANDAGES/DRESSINGS) ×3 IMPLANT
DRSG COVADERM 4X14 (GAUZE/BANDAGES/DRESSINGS) ×3 IMPLANT
ELECT BLADE 4.0 EZ CLEAN MEGAD (MISCELLANEOUS) ×6
ELECT REM PT RETURN 9FT ADLT (ELECTROSURGICAL) ×6
ELECTRODE BLDE 4.0 EZ CLN MEGD (MISCELLANEOUS) ×2 IMPLANT
ELECTRODE REM PT RTRN 9FT ADLT (ELECTROSURGICAL) ×4 IMPLANT
FELT TEFLON 1X6 (MISCELLANEOUS) ×6 IMPLANT
GAUZE 4X4 16PLY ~~LOC~~+RFID DBL (SPONGE) ×3 IMPLANT
GAUZE SPONGE 4X4 12PLY STRL (GAUZE/BANDAGES/DRESSINGS) ×6 IMPLANT
GAUZE SPONGE 4X4 12PLY STRL LF (GAUZE/BANDAGES/DRESSINGS) ×2 IMPLANT
GLOVE BIO SURGEON STRL SZ7 (GLOVE) ×6 IMPLANT
GLOVE BIO SURGEON STRL SZ7.5 (GLOVE) ×1 IMPLANT
GLOVE BIOGEL M STRL SZ7.5 (GLOVE) ×6 IMPLANT
GLOVE BIOGEL PI IND STRL 6 (GLOVE) IMPLANT
GLOVE BIOGEL PI IND STRL 6.5 (GLOVE) IMPLANT
GLOVE BIOGEL PI IND STRL 8.5 (GLOVE) IMPLANT
GLOVE BIOGEL PI INDICATOR 6 (GLOVE) ×9
GLOVE BIOGEL PI INDICATOR 6.5 (GLOVE) ×15
GLOVE BIOGEL PI INDICATOR 8.5 (GLOVE) ×3
GLOVE SS BIOGEL STRL SZ 6 (GLOVE) IMPLANT
GLOVE SS BIOGEL STRL SZ 6.5 (GLOVE) IMPLANT
GLOVE SUPERSENSE BIOGEL SZ 6 (GLOVE) ×3
GLOVE SUPERSENSE BIOGEL SZ 6.5 (GLOVE) ×6
GOWN STRL REUS W/ TWL LRG LVL3 (GOWN DISPOSABLE) ×8 IMPLANT
GOWN STRL REUS W/ TWL XL LVL3 (GOWN DISPOSABLE) ×4 IMPLANT
GOWN STRL REUS W/TWL LRG LVL3 (GOWN DISPOSABLE) ×12
GOWN STRL REUS W/TWL XL LVL3 (GOWN DISPOSABLE) ×6
HEMOSTAT POWDER SURGIFOAM 1G (HEMOSTASIS) ×9 IMPLANT
HEMOSTAT SURGICEL 2X14 (HEMOSTASIS) ×1 IMPLANT
INSERT SUTURE HOLDER (MISCELLANEOUS) ×3 IMPLANT
KIT BASIN OR (CUSTOM PROCEDURE TRAY) ×3 IMPLANT
KIT SUCTION CATH 14FR (SUCTIONS) ×3 IMPLANT
KIT TURNOVER KIT B (KITS) ×3 IMPLANT
KIT VASOVIEW HEMOPRO 2 VH 4000 (KITS) ×3 IMPLANT
LEAD PACING MYOCARDI (MISCELLANEOUS) ×3 IMPLANT
MARKER GRAFT CORONARY BYPASS (MISCELLANEOUS) ×9 IMPLANT
NS IRRIG 1000ML POUR BTL (IV SOLUTION) ×15 IMPLANT
PACK ACCESSORY CANNULA KIT (KITS) ×3 IMPLANT
PACK E OPEN HEART (SUTURE) ×3 IMPLANT
PACK OPEN HEART (CUSTOM PROCEDURE TRAY) ×3 IMPLANT
PAD ARMBOARD 7.5X6 YLW CONV (MISCELLANEOUS) ×6 IMPLANT
PAD ELECT DEFIB RADIOL ZOLL (MISCELLANEOUS) ×3 IMPLANT
PENCIL BUTTON HOLSTER BLD 10FT (ELECTRODE) ×3 IMPLANT
POSITIONER HEAD DONUT 9IN (MISCELLANEOUS) ×3 IMPLANT
PUNCH AORTIC ROTATE 4.0MM (MISCELLANEOUS) ×3 IMPLANT
SET MPS 3-ND DEL (MISCELLANEOUS) ×1 IMPLANT
SPONGE T-LAP 18X18 ~~LOC~~+RFID (SPONGE) ×12 IMPLANT
SUPPORT HEART JANKE-BARRON (MISCELLANEOUS) ×3 IMPLANT
SUT BONE WAX W31G (SUTURE) ×3 IMPLANT
SUT ETHIBOND X763 2 0 SH 1 (SUTURE) ×8 IMPLANT
SUT MNCRL AB 3-0 PS2 18 (SUTURE) ×6 IMPLANT
SUT MNCRL AB 4-0 PS2 18 (SUTURE) ×1 IMPLANT
SUT PDS AB 1 CTX 36 (SUTURE) ×6 IMPLANT
SUT PROLENE 4 0 SH DA (SUTURE) ×3 IMPLANT
SUT PROLENE 5 0 C 1 36 (SUTURE) ×11 IMPLANT
SUT PROLENE 7 0 BV1 MDA (SUTURE) ×4 IMPLANT
SUT STEEL 6MS V (SUTURE) ×6 IMPLANT
SUT VIC AB 2-0 CT1 27 (SUTURE) ×3
SUT VIC AB 2-0 CT1 TAPERPNT 27 (SUTURE) IMPLANT
SYSTEM SAHARA CHEST DRAIN ATS (WOUND CARE) ×3 IMPLANT
TOWEL GREEN STERILE (TOWEL DISPOSABLE) ×3 IMPLANT
TOWEL GREEN STERILE FF (TOWEL DISPOSABLE) ×3 IMPLANT
TRAY FOLEY SLVR 16FR TEMP STAT (SET/KITS/TRAYS/PACK) ×3 IMPLANT
TUBING LAP HI FLOW INSUFFLATIO (TUBING) ×4 IMPLANT
UNDERPAD 30X36 HEAVY ABSORB (UNDERPADS AND DIAPERS) ×3 IMPLANT
WATER STERILE IRR 1000ML POUR (IV SOLUTION) ×6 IMPLANT

## 2021-11-13 NOTE — Progress Notes (Signed)
Patient ID: Mark Casebolt., male   DOB: [birth year], 61 y.o.   MRN: [id number]  TCTS Evening Rounds:   Hemodynamically stable  CI = 3.2  Awake and alert on vent. Can lift head. RR increased during weaning but I think he is fine to extubate. Preop PFT's look good.  Urine output good  CT output low  CBC    Component Value Date/Time   WBC 10.3 11/13/2021 1329   RBC 3.16 (L) 11/13/2021 1329   HGB 9.9 (L) 11/13/2021 1329   HGB 14.1 10/29/2021 0802   HCT 31.0 (L) 11/13/2021 1329   HCT 41.0 10/29/2021 0802   PLT 124 (L) 11/13/2021 1329   PLT 229 10/29/2021 0802   MCV 98.1 11/13/2021 1329   MCV 92 10/29/2021 0802   MCH 31.3 11/13/2021 1329   MCHC 31.9 11/13/2021 1329   RDW 12.6 11/13/2021 1329   RDW 12.3 10/29/2021 0802   LYMPHSABS 1.1 09/18/2021 1029   EOSABS 0.1 09/18/2021 1029   BASOSABS 0.1 09/18/2021 1029     BMET    Component Value Date/Time   NA 139 11/13/2021 1358   NA 141 10/29/2021 0802   K 3.9 11/13/2021 1358   CL 109 11/13/2021 1358   CO2 22 11/13/2021 1358   GLUCOSE 109 (H) 11/13/2021 1358   BUN 14 11/13/2021 1358   BUN 16 10/29/2021 0802   CREATININE 0.79 11/13/2021 1358   CREATININE 0.96 07/02/2021 0830   CALCIUM 8.3 (L) 11/13/2021 1358   EGFR 97 10/29/2021 0802   GFRNONAA >60 11/13/2021 1358   GFRNONAA 97 08/16/2019 0810     A/P:  Stable postop course. Continue current plans. Extubate.

## 2021-11-13 NOTE — Progress Notes (Signed)
Called to bedside due to concern of patient's increased respiratory rate during rapid wean protocol.   Ventilator reporting RR in the 30s but upon exam his respiratory rate is around 18. Lung sounds are clear. He is awake and following commands. Vent switched to PSV and he remained comfortable without changes in his vitals.   Ok to extubate. Order placed.   Freda Jackson, MD [facility] Office: [phone]   See Amion for personal pager PCCM on call pager [phone] until 7pm. Please call Elink 7p-7a. [phone]

## 2021-11-13 NOTE — Consult Note (Signed)
NAME:  Mark [name]., MRN:  [id number], DOB:  13-Feb-1961, LOS: 0 ADMISSION DATE:  11/13/2021, CONSULTATION DATE:  11/13/21 REFERRING MD:  Dr. Kipp Brood, CHIEF COMPLAINT:  CABG   History of Present Illness:  Mr. Mark [name] is a 61 y.o. M with PMH of squamous cell carcinoma, GERD, HTN who began experiencing exertional chest pain approximately one month ago and was evaluated by cardiology.  Found to have three-vessel disease by L heart cath and was referred for CABG.   On 7/5 pt underwent CABG x4 without complication, LIMA to LAD, SVG to diagonal, SVG to PAV, SVG to OM.  He was transferred to intensive care intubated and PCCM consulted   Pertinent  Medical History   has a past medical history of Cancer (Mark [name]) (15-20 years ago), Coronary artery disease, COVID (04/2019), GERD (gastroesophageal reflux disease), and Hypertension.  Significant Hospital Events: Including procedures, antibiotic start and stop dates in addition to other pertinent events   7/5 CABG x4, LIMA to LAD, SVG to diagonal, SVG to PAV, SVG to OM, intubated on low dose neosynephrine   Interim History / Subjective:  Transferred to intensive care intubated and 22mg neosynephrine and Precedex  Objective   Blood pressure 117/90, pulse (!) 58, temperature 98 F (36.7 C), resp. rate 17, height '5\' 8"'$  (1.727 m), weight 79.4 kg, SpO2 95 %.    Vent Mode: SIMV;PRVC FiO2 (%):  [50 %] 50 % Set Rate:  [12 bmp] 12 bmp Vt Set:  [540 mL] 540 mL PEEP:  [5 cmH20] 5 cmH20 Pressure Support:  [10 cmH20] 10 cmH20 Plateau Pressure:  [19 cmH20] 19 cmH20   Intake/Output Summary (Last 24 hours) at 11/13/2021 1344 Last data filed at 11/13/2021 1328 Gross per 24 hour  Intake 3280 ml  Output 1075 ml  Net 2205 ml   Filed Weights   11/13/21 0644  Weight: 79.4 kg    General:  ill-appearing well-nourished elderly M, intubated and sedated  HEENT: MM pink/moist, sclera anicteric, pupils equal, R IJ CVC Neuro: examined after anesthesia on precedex, not  responsive to voice, triggering breaths, RASS -5 CV: s1s2 rrr, no m/r/g, mediastinal chest drain in place PULM:  mechanical breath sounds bilaterally, no wheezing or rhonchi, equal chest rise and synchronous with vent GI: soft, non-distended Extremities: warm/dry, no edema  Skin: no rashes or lesions   Resolved Hospital Problem list     Assessment & Plan:    Three vessel CAD s/p CABG  Post-op Vent Management -post-op management per primary team -rapid vent wean protocol --Maintain full vent support with SAT/SBT as tolerated -titrate Vent setting to maintain SpO2 greater than or equal to 90%. -HOB elevated 30 degrees. -Plateau pressures less than 30 cm H20.  -Precedex for sedation -maintain MAP >65, continue low dose neo-synephrine and albumin   -continue Asa and BB   Thrombocytopenia Mild, acute, platelets 131, >200 at baseline -CBC in AM, trend and monitor for signs of bleeding     Hyperkalemia On pre-op Istat K 5.5 -Check BMP    Best Practice (right click and "Reselect all SmartList Selections" daily)   Diet/type: tubefeeds DVT prophylaxis: SCD GI prophylaxis: H2B Lines: Central line Foley:  Yes, and it is still needed Code Status:  full code Last date of multidisciplinary goals of care discussion [per primary]  Labs   CBC: Recent Labs  Lab 11/11/21 1000 11/13/21 0912 11/13/21 1051 11/13/21 1056 11/13/21 1125 11/13/21 1130 11/13/21 1226  WBC 5.7  --   --   --   --   --   --  HGB 14.5   < > 8.8* 8.8* 8.8* 9.1* 9.5*  HCT 46.7   < > 26.0* 26.0* 26.0* 26.5* 28.0*  MCV 98.7  --   --   --   --   --   --   PLT 210  --   --   --   --  131*  --    < > = values in this interval not displayed.    Basic Metabolic Panel: Recent Labs  Lab 11/11/21 1000 11/13/21 0912 11/13/21 1022 11/13/21 1051 11/13/21 1056 11/13/21 1125 11/13/21 1226  NA 140 141 141 138 137 136 138  K 4.2 4.3 4.2 4.4 5.3* 5.5* 4.8  CL 109 107 107  --   --  102 105  CO2 22  --    --   --   --   --   --   GLUCOSE 96 94 96  --   --  103* 106*  BUN '16 19 18  '$ --   --  16 17  CREATININE 0.84 0.80 0.60*  --   --  0.50* 0.60*  CALCIUM 9.8  --   --   --   --   --   --    GFR: Estimated Creatinine Clearance: 92.6 mL/min (A) (by C-G formula based on SCr of 0.6 mg/dL (L)). Recent Labs  Lab 11/11/21 1000  WBC 5.7    Liver Function Tests: Recent Labs  Lab 11/11/21 1000  AST 28  ALT 38  ALKPHOS 61  BILITOT 0.6  PROT 7.6  ALBUMIN 4.2   No results for input(s): "LIPASE", "AMYLASE" in the last 168 hours. No results for input(s): "AMMONIA" in the last 168 hours.  ABG    Component Value Date/Time   PHART 7.402 11/13/2021 1051   PCO2ART 40.6 11/13/2021 1051   PO2ART 337 (H) 11/13/2021 1051   HCO3 23.2 11/13/2021 1056   TCO2 22 11/13/2021 1226   ACIDBASEDEF 2.0 11/13/2021 1056   O2SAT 88 11/13/2021 1056     Coagulation Profile: Recent Labs  Lab 11/11/21 1000  INR 1.0    Cardiac Enzymes: No results for input(s): "CKTOTAL", "CKMB", "CKMBINDEX", "TROPONINI" in the last 168 hours.  HbA1C: Hgb A1c MFr Bld  Date/Time Value Ref Range Status  11/11/2021 10:00 AM 5.8 (H) 4.8 - 5.6 % Final    Comment:    (NOTE) Pre diabetes:          5.7%-6.4%  Diabetes:              >6.4%  Glycemic control for   <7.0% adults with diabetes   06/23/2016 09:29 AM 5.5 4.8 - 5.6 % Final    Comment:             Pre-diabetes: 5.7 - 6.4          Diabetes: >6.4          Glycemic control for adults with diabetes: <7.0     CBG: Recent Labs  Lab 11/13/21 1331  GLUCAP 112*    Review of Systems:   Unable to obtain  Past Medical History:  He,  has a past medical history of Cancer (Mark [name]) (15-20 years ago), Coronary artery disease, COVID (04/2019), GERD (gastroesophageal reflux disease), and Hypertension.   Surgical History:   Past Surgical History:  Procedure Laterality Date   BACK SURGERY     CATARACT EXTRACTION Left    COLONOSCOPY  03/19/2011   Procedure:  COLONOSCOPY;  Surgeon: Rogene Houston, MD;  [facility];  Service: Endoscopy;  Laterality: N/A;   EYE SURGERY Left    for hole in eye   LEFT HEART CATH AND CORONARY ANGIOGRAPHY N/A 10/30/2021   Procedure: LEFT HEART CATH AND CORONARY ANGIOGRAPHY;  Surgeon: Sherren Mocha, MD;  Location: [facility];  Service: Cardiovascular;  Laterality: N/A;   SHOULDER SURGERY Left    skin cancer removed from face     UMBILICAL HERNIA REPAIR N/A 09/15/2014   Procedure: HERNIA REPAIR UMBILICAL ADULT;  Surgeon: III Dia Crawford, MD;  Location: [facility];  Service: General;  Laterality: N/A;   VASECTOMY       Social History:   reports that he quit smoking about 25 years ago. His smoking use included cigarettes. He started smoking about 40 years ago. He has a 15.00 pack-year smoking history. He has never used smokeless tobacco. He reports current alcohol use. He reports that he does not use drugs.   Family History:  His family history includes Heart disease in his father; Pulmonary fibrosis in his father. There is no history of Colon cancer.   Allergies No Known Allergies   Home Medications  Prior to Admission medications   Medication Sig Start Date End Date Taking? Authorizing Provider  albuterol (VENTOLIN HFA) 108 (90 Base) MCG/ACT inhaler Inhale 2 puffs into the lungs every 4 (four) hours as needed for wheezing or shortness of breath. 10/03/21  Yes Spero Geralds, MD  aspirin EC 81 MG tablet Take 1 tablet (81 mg total) by mouth daily. Swallow whole. 10/25/21  Yes Janina Mayo, MD  famotidine (PEPCID) 20 MG tablet Take 1 tablet (20 mg total) by mouth 2 (two) times daily. 07/11/21  Yes [employer], Trenton Gammon, NP  metoprolol tartrate (LOPRESSOR) 25 MG tablet Take 1 tablet (25 mg total) by mouth 2 (two) times daily. 10/30/21 10/30/22 Yes Sherren Mocha, MD  Multiple Vitamins-Minerals (MULTIVITAMINS THER. W/MINERALS) TABS Take 1 tablet by mouth daily.   Yes [provider]  Multiple  Vitamins-Minerals (PRESERVISION AREDS PO) Take 1 Capful by mouth in the morning and at bedtime.   Yes [provider]  rosuvastatin (CRESTOR) 40 MG tablet Take 1 tablet (40 mg total) by mouth daily. 09/19/21  Yes Kathyrn Drown, MD  valsartan (DIOVAN) 40 MG tablet TAKE 1 TABLET BY MOUTH EVERY DAY 10/09/21  Yes Kathyrn Drown, MD     Critical care time: 35 minutes    CRITICAL CARE Performed by: Otilio Carpen Gaetano Romberger   Total critical care time: 35 minutes  Critical care time was exclusive of separately billable procedures and treating other patients.  Critical care was necessary to treat or prevent imminent or life-threatening deterioration.  Critical care was time spent personally by me on the following activities: development of treatment plan with patient and/or surrogate as well as nursing, discussions with consultants, evaluation of patient's response to treatment, examination of patient, obtaining history from patient or surrogate, ordering and performing treatments and interventions, ordering and review of laboratory studies, ordering and review of radiographic studies, pulse oximetry and re-evaluation of patient's condition.  Otilio Carpen Naly Schwanz, PA-C [facility] See Amion for pager If no response to pager , please call [phone] until 7pm After 7:00 pm call Elink  [phone]?[place]

## 2021-11-13 NOTE — Procedures (Signed)
Extubation Procedure Note  Patient Details:   Name: Mark [name]. DOB: 1960-09-14 MRN: [id number]   Airway Documentation:    Vent end date: 11/13/21 Vent end time: 1850   Evaluation  O2 sats: stable throughout Complications: No apparent complications Patient did tolerate procedure well. Bilateral Breath Sounds: Clear, Diminished   Yes  Patient was extubated to a 4L [place] without any dyspnea or stridor noted. CCM was called to the bedside due to increased RR on SIMV as well as pressure support. Upon exam ventilator was reading RR in the 30's-40's but when counting patient's respirations they were in the low 20's. Orders were given to proceed with extubation without NIF, VC & rapid wean protocol. Positive cuff leak prior to extubation.   Mark [name], [name] 11/13/2021, 6:50 PM

## 2021-11-13 NOTE — Op Note (Signed)
[address]       [place] [address]             [phone]                                          11/13/2021 Patient:  Mark [name]. Pre-Op Dx: 3V CAD HTN HLP   Post-op Dx:  same Procedure: CABG X 4.  LIMA LAD, RSVG PAV, Diagonal, OM1   Endoscopic greater saphenous vein harvest on the right   Surgeon and Role:      * Mark [name], Mark Crater, MD - Primary    * E. Barrett, PA-C - assisting An experienced assistant was required given the complexity of this surgery and the standard of surgical care. The assistant was needed for exposure, dissection, suctioning, retraction of delicate tissues and sutures, instrument exchange and for overall help during this procedure.    Anesthesia  general EBL:  565m Blood Administration: none Xclamp Time:  53 min Pump Time:  952m  Drains: 19 F blake drain: L, mediastinal  Wires: none Counts: correct   Indications: [age]o male with 3V CAD.  We discussed the risks and benefits of surgical revascularization he is agreeable to proceed.  He is tentatively scheduled for early July.  We also discussed the use of the radial artery but given his not working for not to use that graft.  We will plan for CABG 4  Findings: Good LIMA, good vein.  Intramyocardial LAD, small diag, good PAV, and OM  Operative Technique: All invasive lines were placed in pre-op holding.  After the risks, benefits and alternatives were thoroughly discussed, the patient was brought to the operative theatre.  Anesthesia was induced, and the patient was prepped and draped in normal sterile fashion.  An appropriate surgical pause was performed, and pre-operative antibiotics were dosed accordingly.  We began with simultaneous incisions along the right leg for harvesting of the greater saphenous vein and the chest for the sternotomy.  In regards to the sternotomy, this was carried down with bovie cautery, and the sternum was divided with a reciprocating  saw.  Meticulous hemostasis was obtained.  The left internal thoracic artery was exposed and harvested in in pedicled fashion.  The patient was systemically heparinized, and the artery was divided distally, and placed in a papaverine sponge.    The sternal elevator was removed, and a retractor was placed.  The pericardium was divided in the midline and fashioned into a cradle with pericardial stitches.   After we confirmed an appropriate ACT, the ascending aorta was cannulated in standard fashion.  The right atrial appendage was used for venous cannulation site.  Cardiopulmonary bypass was initiated, and the heart retractor was placed. The cross clamp was applied, and a dose of anterograde cardioplegia was given with good arrest of the heart.  We moved to the posterior wall of the heart, and found a good target on the PAV.  An arteriotomy was made, and the vein graft was anastomosed to it in an end to side fashion.  Next we exposed the lateral wall, and found a good target on the OM1.  An end to side anastomosis with the vein graft was then created.  Next, we exposed the anterior wall of the heart and identified a good target on diagonal.   An arteriotomy was created.  The vein was anastomosed in an end to side fashion.  Finally, we exposed a good target on the LAD, and fashioned an end to side anastomosis between it and the LITA.  We began to re-warm, and a re-animation dose of cardioplegia was given.  The heart was de-aired, and the cross clamp was removed.  Meticulous hemostasis was obtained.    A partial occludding clamp was then placed on the ascending aorta, and we created an end to side anastomosis between it and the proximal vein grafts.  Rings were placed on the proximal anastomosis.  Hemostasis was obtained, and we separated from cardiopulmonary bypass without event.  The heparin was reversed with protamine.  Chest tubes and wires were placed, and the sternum was re-approximated with sternal wires.   The soft tissue and skin were re-approximated wth absorbable suture.    The patient tolerated the procedure without any immediate complications, and was transferred to the ICU in guarded condition.  Mark [name] Mark [name]

## 2021-11-13 NOTE — Interval H&P Note (Signed)
History and Physical Interval Note:  11/13/2021 8:34 AM  Mark Dell.  has presented today for surgery, with the diagnosis of CAD.  The various methods of treatment have been discussed with the patient and family. After consideration of risks, benefits and other options for treatment, the patient has consented to  Procedure(s): CORONARY ARTERY BYPASS GRAFTING (CABG) (N/A) TRANSESOPHAGEAL ECHOCARDIOGRAM (TEE) (N/A) as a surgical intervention.  The patient's history has been reviewed, patient examined, no change in status, stable for surgery.  I have reviewed the patient's chart and labs.  Questions were answered to the patient's satisfaction.     Mark [name]

## 2021-11-13 NOTE — Hospital Course (Addendum)
History of Present Illness:  Mark [name] is a 61 yo male with history of squamous cell carcinoma.  He was referred to Cardiology after suffering episodes of chest pain and shortness of breath.  His most recent episodes were worse with exertion when performing yard work.  He also notes he was more short of breath with ambulation.  He had a myoview in May which was felt to be low risk. He did have some T waves changes with stress.  He was evaluated by Dr. Phineas Inches on 10/29/2021 who recommended cardiac catheterization and the patient was agreeable to this.  This was performed on 10/30/2021 and showed multivessel CAD felt to require coronary bypass grafting.  The patient was referred to [facility].  He was evaluated by Dr. Kipp Brood at which time the patient again admitted to progressive exertional dyspnea and anginal symptoms.  It was felt the patient would benefit from coronary bypass grafting.  The risks and benefits of the procedure were explained to the patient and they were agreeable to proceed.  Hospital Course:  [name] presented to [facility] on 11/13/2021.  He was taken to the operating room and underwent CABG x 4 utilizing LIMA to LAD, SVG to OM, SVG to Diagonal, and SVG to PAV.  He also underwent endoscopic harvest greater saphenous vein from his right leg.  He tolerated the procedure without difficulty and was taken to SICU in stable condition.  The patient was extubated the evening of surgery.  During his stay in the SICU the patient was weaned off Neo-synephrine as hemodynamics allowed.  His chest tubes and lines were removed without difficulty.  The patient was started on diuretics for volume overloaded state.  He was maintaining NSR and felt stable for transfer to the progressive care unit on 11/14/2021.

## 2021-11-13 NOTE — Anesthesia Procedure Notes (Signed)
Arterial Line Insertion Start/End7/09/2021 8:00 AM, 11/13/2021 8:02 AM Performed by: Betha Loa, CRNA, CRNA  Patient location: Pre-op. Preanesthetic checklist: patient identified, IV checked, site marked, risks and benefits discussed, surgical consent, monitors and equipment checked, pre-op evaluation, timeout performed and anesthesia consent Lidocaine 1% used for infiltration and patient sedated Left, radial was placed Catheter size: 20 G Maximum sterile barriers used   Attempts: 1 Procedure performed without using ultrasound guided technique. Following insertion, Biopatch and dressing applied. Post procedure assessment: normal  Patient tolerated the procedure well with no immediate complications.

## 2021-11-13 NOTE — Brief Op Note (Signed)
11/13/2021  8:51 AM  PATIENT:  Mark [name].  61 y.o. male  PRE-OPERATIVE DIAGNOSIS:  CORONARY ARTERY DISEASE  POST-OPERATIVE DIAGNOSIS:  CORONARY ARTERY DISEASE  PROCEDURE:  Procedure(s):  CORONARY ARTERY BYPASS GRAFTING X 4 -LIMA to LAD -SVG to DIAGONAL -SVG to PAV -SVG to OM  ENDOSCOPIC RIGHT GREATER SAPHENOUS VEIN HARVEST.  Vein harvest time: 30 min Vein prep time: 15 min  TRANSESOPHAGEAL ECHOCARDIOGRAM (TEE) (N/A)  SURGEON:  Surgeon(s) and Role:    * Lightfoot, Lucile Crater, MD - Primary  PHYSICIAN ASSISTANT: Ellwood Handler PA-C  ASSISTANTS: Dineen Kid RNFA   ANESTHESIA:   general  EBL:  Per Anesthesia Record  BLOOD ADMINISTERED:   CC CELLSAVER  DRAINS:  Left Pleural Chest Tube, Mediastinal Chest Drains    LOCAL MEDICATIONS USED:  NONE  SPECIMEN:  No Specimen  DISPOSITION OF SPECIMEN:  N/A  COUNTS:  YES  TOURNIQUET:  * No tourniquets in log *  DICTATION: .Dragon Dictation  PLAN OF CARE: Admit to inpatient   PATIENT DISPOSITION:  ICU - intubated and hemodynamically stable.   Delay start of Pharmacological VTE agent (>24hrs) due to surgical blood loss or risk of bleeding: yes

## 2021-11-13 NOTE — Transfer of Care (Signed)
Immediate Anesthesia Transfer of Care Note  Patient: Mark [name].  Procedure(s) Performed: CORONARY ARTERY BYPASS GRAFTING (CABG) X FOUR BYPASSES USING OPEN LEFT INTERNAL MAMMARY ARTERY AND ENDOSCOPIC RIGHT GREATER SAPHENOUS VEIN HARVEST. (Chest) TRANSESOPHAGEAL ECHOCARDIOGRAM (TEE) (Esophagus)  Patient Location: SICU  Anesthesia Type:General  Level of Consciousness: sedated and Patient remains intubated per anesthesia plan  Airway & Oxygen Therapy: Patient remains intubated per anesthesia plan and Patient placed on Ventilator (see vital sign flow sheet for setting)  Post-op Assessment: Report given to RN and Post -op Vital signs reviewed and stable  Post vital signs: Reviewed and stable  Last Vitals:  Vitals Value Taken Time  BP 124/86 11/13/21 1321  Temp 36.3 C 11/13/21 1326  Pulse 60 11/13/21 1326  Resp 13 11/13/21 1326  SpO2 97 % 11/13/21 1326  Vitals shown include unvalidated device data.  Last Pain:  Vitals:   11/13/21 0653  PainSc: 0-No pain         Complications: No notable events documented.

## 2021-11-13 NOTE — Progress Notes (Signed)
  Echocardiogram Echocardiogram Transesophageal has been performed.  Mark [name] 11/13/2021, 5:54 PM

## 2021-11-13 NOTE — Anesthesia Procedure Notes (Addendum)
Central Venous Catheter Insertion Performed by: Darral Dash, DO, anesthesiologist Start/End7/09/2021 8:00 AM, 11/13/2021 8:15 AM Patient location: Pre-op. Preanesthetic checklist: patient identified, IV checked, site marked, risks and benefits discussed, surgical consent, monitors and equipment checked, pre-op evaluation, timeout performed and anesthesia consent Position: Trendelenburg Lidocaine 1% used for infiltration and patient sedated Hand hygiene performed  and maximum sterile barriers used  Catheter size: 8.5 Fr Central line was placed.Sheath introducer Procedure performed using ultrasound guided technique. Ultrasound Notes:anatomy identified, needle tip was noted to be adjacent to the nerve/plexus identified, no ultrasound evidence of intravascular and/or intraneural injection and image(s) printed for medical record Attempts: 1 Following insertion, line sutured, dressing applied and Biopatch. Post procedure assessment: blood return through all ports, free fluid flow and no air  Patient tolerated the procedure well with no immediate complications. Additional procedure comments: Triple lumen placed through introducer. All ports blood draw and flushed appropriately without issue. Marland Kitchen

## 2021-11-14 ENCOUNTER — Inpatient Hospital Stay (HOSPITAL_COMMUNITY): Payer: PRIVATE HEALTH INSURANCE

## 2021-11-14 ENCOUNTER — Encounter (HOSPITAL_COMMUNITY): Payer: Self-pay | Admitting: Thoracic Surgery (Cardiothoracic Vascular Surgery)

## 2021-11-14 DIAGNOSIS — Z951 Presence of aortocoronary bypass graft: Secondary | ICD-10-CM | POA: Diagnosis not present

## 2021-11-14 DIAGNOSIS — I2510 Atherosclerotic heart disease of native coronary artery without angina pectoris: Secondary | ICD-10-CM | POA: Diagnosis not present

## 2021-11-14 LAB — CBC
HCT: 28.4 % — ABNORMAL LOW (ref 39.0–52.0)
HCT: 30.3 % — ABNORMAL LOW (ref 39.0–52.0)
Hemoglobin: 8.9 g/dL — ABNORMAL LOW (ref 13.0–17.0)
Hemoglobin: 9.8 g/dL — ABNORMAL LOW (ref 13.0–17.0)
MCH: 31.1 pg (ref 26.0–34.0)
MCH: 32 pg (ref 26.0–34.0)
MCHC: 31.3 g/dL (ref 30.0–36.0)
MCHC: 32.3 g/dL (ref 30.0–36.0)
MCV: 99.3 fL (ref 80.0–100.0)
MCV: 99 fL (ref 80.0–100.0)
Platelets: 124 10*3/uL — ABNORMAL LOW (ref 150–400)
Platelets: 125 10*3/uL — ABNORMAL LOW (ref 150–400)
RBC: 2.86 MIL/uL — ABNORMAL LOW (ref 4.22–5.81)
RBC: 3.06 MIL/uL — ABNORMAL LOW (ref 4.22–5.81)
RDW: 12.8 % (ref 11.5–15.5)
RDW: 13.1 % (ref 11.5–15.5)
WBC: 8.7 10*3/uL (ref 4.0–10.5)
WBC: 9.7 10*3/uL (ref 4.0–10.5)
nRBC: 0 % (ref 0.0–0.2)
nRBC: 0 % (ref 0.0–0.2)

## 2021-11-14 LAB — BASIC METABOLIC PANEL
Anion gap: 7 (ref 5–15)
Anion gap: 8 (ref 5–15)
BUN: 13 mg/dL (ref 8–23)
BUN: 15 mg/dL (ref 8–23)
CO2: 23 mmol/L (ref 22–32)
CO2: 25 mmol/L (ref 22–32)
Calcium: 7.8 mg/dL — ABNORMAL LOW (ref 8.9–10.3)
Calcium: 7.9 mg/dL — ABNORMAL LOW (ref 8.9–10.3)
Chloride: 108 mmol/L (ref 98–111)
Chloride: 103 mmol/L (ref 98–111)
Creatinine, Ser: 0.86 mg/dL (ref 0.61–1.24)
Creatinine, Ser: 0.89 mg/dL (ref 0.61–1.24)
GFR, Estimated: >60 mL/min (ref >60)
GFR, Estimated: >60 mL/min (ref >60)
Glucose, Bld: 108 mg/dL — ABNORMAL HIGH (ref 70–99)
Glucose, Bld: 115 mg/dL — ABNORMAL HIGH (ref 70–99)
Potassium: 4.2 mmol/L (ref 3.5–5.1)
Potassium: 4.1 mmol/L (ref 3.5–5.1)
Sodium: 138 mmol/L (ref 135–145)
Sodium: 136 mmol/L (ref 135–145)

## 2021-11-14 LAB — GLUCOSE, CAPILLARY
Glucose-Capillary: 104 mg/dL — ABNORMAL HIGH (ref 70–99)
Glucose-Capillary: 106 mg/dL — ABNORMAL HIGH (ref 70–99)
Glucose-Capillary: 111 mg/dL — ABNORMAL HIGH (ref 70–99)
Glucose-Capillary: 115 mg/dL — ABNORMAL HIGH (ref 70–99)
Glucose-Capillary: 115 mg/dL — ABNORMAL HIGH (ref 70–99)
Glucose-Capillary: 116 mg/dL — ABNORMAL HIGH (ref 70–99)
Glucose-Capillary: 147 mg/dL — ABNORMAL HIGH (ref 70–99)
Glucose-Capillary: 64 mg/dL — ABNORMAL LOW (ref 70–99)
Glucose-Capillary: 68 mg/dL — ABNORMAL LOW (ref 70–99)
Glucose-Capillary: 74 mg/dL (ref 70–99)
Glucose-Capillary: 83 mg/dL (ref 70–99)
Glucose-Capillary: 89 mg/dL (ref 70–99)
Glucose-Capillary: 90 mg/dL (ref 70–99)

## 2021-11-14 LAB — MAGNESIUM
Magnesium: 2.3 mg/dL (ref 1.7–2.4)
Magnesium: 2.4 mg/dL (ref 1.7–2.4)

## 2021-11-14 MED ORDER — ALBUMIN HUMAN 5 % IV SOLN
12.5000 g | Freq: Once | INTRAVENOUS | Status: AC
  Administered 2021-11-14: 12.5 g via INTRAVENOUS
  Filled 2021-11-14: qty 250

## 2021-11-14 MED ORDER — INSULIN ASPART 100 UNIT/ML IJ SOLN: 0.0000 [IU] | INTRAMUSCULAR | Status: DC

## 2021-11-14 MED ORDER — ENOXAPARIN SODIUM 40 MG/0.4ML IJ SOSY
40.0000 mg | PREFILLED_SYRINGE | Freq: Every day | INTRAMUSCULAR | Status: DC
  Administered 2021-11-14 – 2021-11-15 (×2): 40 mg via SUBCUTANEOUS
  Filled 2021-11-14 (×2): qty 0.4

## 2021-11-14 MED ORDER — ORAL CARE MOUTH RINSE
15.0000 mL | OROMUCOSAL | Status: DC | PRN
Start: 2021-11-14 — End: 2021-11-17

## 2021-11-14 NOTE — Progress Notes (Signed)
NAME:  Mark Sobel., MRN:  [id number], DOB:  01/01/1961, LOS: 1 ADMISSION DATE:  11/13/2021, CONSULTATION DATE:  11/14/21 REFERRING MD:  Dr. Kipp Brood, CHIEF COMPLAINT:  CABG   History of Present Illness:  Mark [name] is a 61 y.o. M with PMH of squamous cell carcinoma, GERD, HTN who began experiencing exertional chest pain approximately one month ago and was evaluated by cardiology.  Found to have three-vessel disease by L heart cath and was referred for CABG.   On 7/5 pt underwent CABG x4 without complication, LIMA to LAD, SVG to diagonal, SVG to PAV, SVG to OM.  He was transferred to intensive care intubated and PCCM consulted.  Pertinent  Medical History   has a past medical history of Cancer ([place]) (15-20 years ago), Coronary artery disease, COVID (04/2019), GERD (gastroesophageal reflux disease), and Hypertension.  Significant Hospital Events: Including procedures, antibiotic start and stop dates in addition to other pertinent events   7/5 CABG x4, LIMA to LAD, SVG to diagonal, SVG to PAV, SVG to OM, intubated on low dose neosynephrine   Interim History / Subjective:  Today he denies complaints other than minimal postop chest pain.  Tmax 100.4.  Objective   Blood pressure 97/70, pulse 83, temperature 99.5 F (37.5 C), resp. rate 13, height '5\' 8"'$  (1.727 m), weight 84.7 kg, SpO2 95 %. CVP:  [1 mmHg-19 mmHg] 10 mmHg  Vent Mode: SIMV;PRVC;PSV FiO2 (%):  [50 %] 50 % Set Rate:  [12 bmp-14 bmp] 14 bmp Vt Set:  [540 mL] 540 mL PEEP:  [5 cmH20] 5 cmH20 Pressure Support:  [10 cmH20] 10 cmH20 Plateau Pressure:  [19 cmH20] 19 cmH20   Intake/Output Summary (Last 24 hours) at 11/14/2021 1008 Last data filed at 11/14/2021 1000 Gross per 24 hour  Intake 5525.7 ml  Output 2550 ml  Net 2975.7 ml    Filed Weights   11/13/21 0644 11/14/21 0500  Weight: 79.4 kg 84.7 kg    General: Middle-age man sitting up in the recliner no acute distress HEENT: [place]/AT, eyes anicteric.  Mild voice  hoarseness Neck: RIJ CVC in place Neuro: Awake and alert, moving all extremities spontaneously.  Globally weak.  Answering questions appropriately. CV: S1-S2, regular rate and rhythm.  Mediastinal and chest tubes with mild serosanguineous output PULM: Breathing comfortably on nasal cannula, reduced basilar breath sounds.  No conversational dyspnea. GI: Soft, nontender, nondistended Extremities: Minimal lower extremity edema, no cyanosis Skin: Warm, dry, no rashes  BUN 13 Creatinine 0.86 H/H 8.9/28.4 Platelets 124 CXR 1 view personally reviewed-use.  RIJ CVC remains in place.  Left-sided chest tubes.  Resolved Hospital Problem list     Assessment & Plan:  Post-op vent management - Pulmonary hygiene -Supplemental oxygen as able to maintain SPO2 greater than 90%.  CAD s/p CABG x 3 -Complete postop antibiotics - Pain control-acetaminophen, morphine, oxycodone, Ultram - Postop care per cardiothoracic surgery --Continue aspirin, Crestor -Wean Neo-Synephrine as able to maintain MAP greater than 65.  Can start metoprolol once off vasopressors.  Continue CBC.  Acute anemia due to expected postop blood loss Thrombocytopenia, consumptive, expected postop -Monitor, no indication for transfusion  Mild hyperglycemia Prediabetes - Needs outpatient follow-up -Sliding scale insulin  Former tobacco abuse, quit 1998 - Does not require lung cancer screening  No family bedside during rounds.  Best Practice (right click and "Reselect all SmartList Selections" daily)   Diet/type: tubefeeds DVT prophylaxis: SCD GI prophylaxis: H2B Lines: Central line Foley:  Yes, and it is still needed  Code Status:  full code Last date of multidisciplinary goals of care discussion [per primary]  Labs   CBC: Recent Labs  Lab 11/11/21 1000 11/13/21 0912 11/13/21 1130 11/13/21 1226 11/13/21 1329 11/13/21 1340 11/13/21 1952 11/13/21 1954 11/13/21 2130 11/13/21 2142 11/14/21 0305  WBC 5.7  --    --   --  10.3  --  10.6*  --   --   --  8.7  HGB 14.5   < > 9.1*   < > 9.9*   < > 10.9* 12.2* 8.5* 10.5* 8.9*  HCT 46.7   < > 26.5*   < > 31.0*   < > 33.9* 36.0* 25.0* 31.0* 28.4*  MCV 98.7  --   --   --  98.1  --  98.5  --   --   --  99.3  PLT 210  --  131*  --  124*  --  146*  --   --   --  124*   < > = values in this interval not displayed.     Basic Metabolic Panel: Recent Labs  Lab 11/11/21 1000 11/13/21 0912 11/13/21 1125 11/13/21 1226 11/13/21 1340 11/13/21 1358 11/13/21 1838 11/13/21 1952 11/13/21 1954 11/13/21 2130 11/13/21 2142 11/14/21 0305  NA 140   < > 136 138   < > 139   < > 138 140 145 142 138  K 4.2   < > 5.5* 4.8   < > 3.9   < > 5.2* 5.2* 3.3* 4.1 4.2  CL 109   < > 102 105  --  109  --  108  --   --   --  108  CO2 22  --   --   --   --  22  --  20*  --   --   --  23  GLUCOSE 96   < > 103* 106*  --  109*  --  121*  --   --   --  108*  BUN 16   < > 16 17  --  14  --  15  --   --   --  13  CREATININE 0.84   < > 0.50* 0.60*  --  0.79  --  0.91  --   --   --  0.86  CALCIUM 9.8  --   --   --   --  8.3*  --  8.4*  --   --   --  7.8*  MG  --   --   --   --   --   --   --  2.7*  --   --   --  2.3   < > = values in this interval not displayed.    GFR: Estimated Creatinine Clearance: 94.4 mL/min (by C-G formula based on SCr of 0.86 mg/dL). Recent Labs  Lab 11/11/21 1000 11/13/21 1329 11/13/21 1952 11/14/21 0305  WBC 5.7 10.3 10.6* 8.7     Liver Function Tests: Recent Labs  Lab 11/11/21 1000  AST 28  ALT 38  ALKPHOS 61  BILITOT 0.6  PROT 7.6  ALBUMIN 4.2    No results for input(s): "LIPASE", "AMYLASE" in the last 168 hours. No results for input(s): "AMMONIA" in the last 168 hours.  ABG    Component Value Date/Time   PHART 7.349 (L) 11/13/2021 2142   PCO2ART 41.7 11/13/2021 2142   PO2ART 84 11/13/2021 2142   HCO3 22.8 11/13/2021 2142  TCO2 24 11/13/2021 2142   ACIDBASEDEF 3.0 (H) 11/13/2021 2142   O2SAT 95 11/13/2021 2142     This  patient is critically ill with multiple organ system failure which requires frequent high complexity decision making, assessment, support, evaluation, and titration of therapies. This was completed through the application of advanced monitoring technologies and extensive interpretation of multiple databases. During this encounter critical care time was devoted to patient care services described in this note for 36 minutes.   Donyel Hy, DO 11/14/21 10:43 AM [facility]

## 2021-11-14 NOTE — Discharge Summary (Signed)
[address]       [place] [address]             [phone]    Physician Discharge Summary  Patient ID: Keiyon Plack. MRN: [id number] DOB/AGE: May 04, 1961 61 y.o.  Admit date: 11/13/2021 Discharge date: 11/17/2021  Admission Diagnoses:  Patient Active Problem List   Diagnosis Date Noted   Acute postoperative respiratory insufficiency    CAD, multiple vessel    Primary hypertension 06/06/2021   Injury of left shoulder 05/10/2020   Vitreous floaters of right eye 12/31/2015   GERD (gastroesophageal reflux disease) 12/31/2015   Hyperlipidemia 07/19/2013   Discharge Diagnoses:  Patient Active Problem List   Diagnosis Date Noted   S/P CABG x 4 11/13/2021   Acute postoperative respiratory insufficiency    CAD, multiple vessel    Primary hypertension 06/06/2021   Injury of left shoulder 05/10/2020   Vitreous floaters of right eye 12/31/2015   GERD (gastroesophageal reflux disease) 12/31/2015   Hyperlipidemia 07/19/2013   Discharged Condition: good  History of Present Illness:  Chukwuma Straus is a 61 yo male with history of squamous cell carcinoma.  He was referred to Cardiology after suffering episodes of chest pain and shortness of breath.  His most recent episodes were worse with exertion when performing yard work.  He also notes he was more short of breath with ambulation.  He had a myoview in May which was felt to be low risk. He did have some T waves changes with stress.  He was evaluated by Dr. Phineas Inches on 10/29/2021 who recommended cardiac catheterization and the patient was agreeable to this.  This was performed on 10/30/2021 and showed multivessel CAD felt to require coronary bypass grafting.  The patient was referred to [facility].  He was evaluated by Dr. Kipp Brood at which time the patient again admitted to progressive exertional dyspnea and anginal symptoms.  It was felt the patient would benefit from coronary bypass  grafting.  The risks and benefits of the procedure were explained to the patient and they were agreeable to proceed.  Hospital Course:  Layn Kye presented to [facility] on 11/13/2021.  He was taken to the operating room and underwent CABG x 4 utilizing LIMA to LAD, SVG to OM, SVG to Diagonal, and SVG to PAV.  He also underwent endoscopic harvest greater saphenous vein from his right leg.  He tolerated the procedure without difficulty and was taken to SICU in stable condition.  The patient was extubated the evening of surgery.  During his stay in the SICU the patient was weaned off Neo-synephrine as hemodynamics allowed.  His chest tubes and lines were removed without difficulty.  The patient was started on diuretics for volume overloaded state.  He was maintaining NSR and felt stable for transfer to the progressive care unit on 11/14/2021.  Consults: None  Significant Diagnostic Studies: angiography:   1.  Patent left main stem with no significant stenosis 2.  Severe 70 to 75% proximal LAD stenosis with critical 95% first diagonal stenosis 3.  Moderate stenosis in the mid circumflex into the second OM and severe 90% stenosis of the first OM 4.  Nonobstructive RCA stenosis with severe 95% stenosis of the first posterolateral branch (largest of the distal RCA branch vessels)   Recommend outpatient cardiac surgical consultation for consideration of CABG.  PCI could also be performed in this patient with a low syntax score but  he would require multivessel stenting of the LAD, first diagonal, first obtuse marginal, and first posterolateral branch of the RCA.  I think he would likely do best long-term with multivessel CABG.  We will start metoprolol 25 mg twice daily and he will otherwise continue on his current medications.  Treatments: surgery:   11/13/2021 Patient:  [name]. Pre-Op Dx: 3V CAD HTN HLP   Post-op Dx:  same Procedure: CABG X 4.  LIMA LAD, RSVG PAV, Diagonal, OM1    Endoscopic greater saphenous vein harvest on the right    Surgeon and Role:      * Lightfoot, Lucile Crater, MD - Primary    * E. Damir Leung, PA-C - assisting An experienced assistant was required given the complexity of this surgery and the standard of surgical care. The assistant was needed for exposure, dissection, suctioning, retraction of delicate tissues and sutures, instrument exchange and for overall help during this procedure.    Discharge Exam: Blood pressure (!) 125/99, pulse 89, temperature 98.6 F (37 C), temperature source Oral, resp. rate 17, height '5\' 8"'$  (1.727 m), weight 80.9 kg, SpO2 94 %.  General appearance: alert, cooperative, and no distress Heart: regular rate and rhythm and tachy Lungs: clear to auscultation bilaterally Abdomen: soft, non-tender; bowel sounds normal; no masses,  no organomegaly Extremities: edema trace Wound: clean and dry   Discharge Medications:  The patient has been discharged on:   1.Beta Blocker:  Yes [ X  ]                              No   [   ]                              If No, reason:  2.Ace Inhibitor/ARB: Yes [   ]                                     No  [ X   ]                                     If No, reason:  3.Statin:   Yes Valu.Nieves   ]                  No  [   ]                  If No, reason:  4.Ecasa:  Yes  [  X ]                  No   [   ]                  If No, reason:  Patient had ACS upon admission: No   Plavix/P2Y12 inhibitor: Yes [   ]                                      No  [x  ]   Discharge Instructions     Amb Referral to Cardiac Rehabilitation   Complete by: As directed    Diagnosis: CABG  CABG X ___: 4   After initial evaluation and assessments completed: Virtual Based Care may be provided alone or in conjunction with Phase 2 Cardiac Rehab based on patient barriers.: Yes      Allergies as of 11/17/2021   No Known Allergies      Medication List     STOP taking these medications    valsartan  40 MG tablet Commonly known as: DIOVAN       TAKE these medications    acetaminophen 500 MG tablet Commonly known as: TYLENOL Take 1-2 tablets (500-1,000 mg total) by mouth every 6 (six) hours as needed.   albuterol 108 (90 Base) MCG/ACT inhaler Commonly known as: VENTOLIN HFA Inhale 2 puffs into the lungs every 4 (four) hours as needed for wheezing or shortness of breath.   aspirin EC 325 MG tablet Take 1 tablet (325 mg total) by mouth daily. What changed:  medication strength how much to take additional instructions   famotidine 20 MG tablet Commonly known as: PEPCID Take 1 tablet (20 mg total) by mouth 2 (two) times daily.   furosemide 40 MG tablet Commonly known as: Lasix Take 1 tablet (40 mg total) by mouth daily.   metoprolol tartrate 25 MG tablet Commonly known as: LOPRESSOR Take 1 tablet (25 mg total) by mouth 2 (two) times daily.   PRESERVISION AREDS PO Take 1 Capful by mouth in the morning and at bedtime.   multivitamins ther. w/minerals Tabs tablet Take 1 tablet by mouth daily.   potassium chloride SA 20 MEQ tablet Commonly known as: KLOR-CON M Take 1 tablet (20 mEq total) by mouth daily.   rosuvastatin 40 MG tablet Commonly known as: CRESTOR Take 1 tablet (40 mg total) by mouth daily.   traMADol 50 MG tablet Commonly known as: ULTRAM Take 1 tablet (50 mg total) by mouth every 4 (four) hours as needed for moderate pain.        Follow-up Information     Lajuana Matte, MD Follow up on 11/29/2021.   Specialty: Cardiothoracic Surgery Why: The appoinment is a telephone visit.  You do not need to come to the office.  Your appointment is at 3:10, Dr. Kipp Brood will call you Contact information: [place] [address] [phone]         Deberah Pelton, NP Follow up on 12/06/2021.   Specialty: Cardiology Why: Appointment is at 8:25 Contact information: [address] [place]  [address] [phone]                 Signed:  Ellwood Handler, PA-C  11/17/2021, 8:38 AM

## 2021-11-14 NOTE — Progress Notes (Signed)
      [address]       [place] [address]             [phone]                 1 Day Post-Op Procedure(s) (LRB): CORONARY ARTERY BYPASS GRAFTING (CABG) X FOUR BYPASSES USING OPEN LEFT INTERNAL MAMMARY ARTERY AND ENDOSCOPIC RIGHT GREATER SAPHENOUS VEIN HARVEST. (N/A) TRANSESOPHAGEAL ECHOCARDIOGRAM (TEE) (N/A)   Events: No events _______________________________________________________________ Vitals: BP 98/70   Pulse 81   Temp 100.2 F (37.9 C)   Resp 11   Ht '5\' 8"'$  (1.727 m)   Wt 84.7 kg   SpO2 94%   BMI 28.39 kg/m  Filed Weights   11/13/21 0644 11/14/21 0500  Weight: 79.4 kg 84.7 kg     - Neuro: alert NAD  - Cardiovascular: sinus  Drips: neo 55.   CVP:  [1 mmHg-18 mmHg] 17 mmHg  - Pulm: EWOB   ABG    Component Value Date/Time   PHART 7.349 (L) 11/13/2021 2142   PCO2ART 41.7 11/13/2021 2142   PO2ART 84 11/13/2021 2142   HCO3 22.8 11/13/2021 2142   TCO2 24 11/13/2021 2142   ACIDBASEDEF 3.0 (H) 11/13/2021 2142   O2SAT 95 11/13/2021 2142    - Abd: ND - Extremity: trace edema  .Intake/Output      07/05 0701 07/06 0700 07/06 0701 07/07 0700   I.V. (mL/kg) 3328 (39.3)    Blood 380    IV Piggyback 1641.7    Total Intake(mL/kg) 5349.8 (63.2)    Urine (mL/kg/hr) 2300 (1.1)    Chest Tube 400    Total Output 2700    Net +2649.8            _______________________________________________________________ Labs:    Latest Ref Rng & Units 11/14/2021    3:05 AM 11/13/2021    9:42 PM 11/13/2021    9:30 PM  CBC  WBC 4.0 - 10.5 K/uL 8.7     Hemoglobin 13.0 - 17.0 g/dL 8.9  10.5  8.5   Hematocrit 39.0 - 52.0 % 28.4  31.0  25.0   Platelets 150 - 400 K/uL 124         Latest Ref Rng & Units 11/14/2021    3:05 AM 11/13/2021    9:42 PM 11/13/2021    9:30 PM  CMP  Glucose 70 - 99 mg/dL 108     BUN 8 - 23 mg/dL 13     Creatinine 0.61 - 1.24 mg/dL 0.86     Sodium 135 - 145 mmol/L 138  142  145   Potassium 3.5 - 5.1 mmol/L 4.2  4.1  3.3    Chloride 98 - 111 mmol/L 108     CO2 22 - 32 mmol/L 23     Calcium 8.9 - 10.3 mg/dL 7.8       CXR: PV congestion  _______________________________________________________________  Assessment and Plan: POD 1 s/p CABG  Neuro: pain controlled CV: on A/S/BB.  Will wean neo.  Will remove A-line Pulm: IS, ambulation Renal: creat stable.  Good uop GI: on diet Heme: stable ID: afebrile Endo: SSI Dispo: continue ICU care   Mark [name] O Mark [name] 11/14/2021 8:04 AM

## 2021-11-14 NOTE — TOC Initial Note (Signed)
Transition of Care (TOC) - Initial/Assessment Note    Patient Details  Name: Mark [name]. MRN: [id number] Date of Birth: [birth year]  Transition of Care [facility]) CM/SW Contact:    Ninfa Meeker, RN Phone Number: 11/14/2021, 1:23 PM  Clinical Narrative:  Transition of Care Screening Note:     Transition of Care Department [facility]) has reviewed patient and no TOC needs have been identified at this time. We will continue to monitor patient advancement through Interdisciplinary progressions. If new patient transition needs arise, please place a consult.                       Patient Goals and CMS Choice        Expected Discharge Plan and Services                                                Prior Living Arrangements/Services                       Activities of Daily Living      Permission Sought/Granted                  Emotional Assessment              Admission diagnosis:  S/P CABG x 4 [Z95.1] Patient Active Problem List   Diagnosis Date Noted   S/P CABG x 4 11/13/2021   Acute postoperative respiratory insufficiency    CAD, multiple vessel    Primary hypertension 06/06/2021   Injury of left shoulder 05/10/2020   Vitreous floaters of right eye 12/31/2015   GERD (gastroesophageal reflux disease) 12/31/2015   Hyperlipidemia 07/19/2013   PCP:  Kathyrn Drown, MD Pharmacy:   [id number] [place] [address] [address]Phone: [phone]: [phone]    Social Determinants of Health (SDOH) Interventions    Readmission Risk Interventions     No data to display

## 2021-11-14 NOTE — Anesthesia Postprocedure Evaluation (Signed)
Anesthesia Post Note  Patient: Rick Carruthers.  Procedure(s) Performed: CORONARY ARTERY BYPASS GRAFTING (CABG) X FOUR BYPASSES USING OPEN LEFT INTERNAL MAMMARY ARTERY AND ENDOSCOPIC RIGHT GREATER SAPHENOUS VEIN HARVEST. (Chest) TRANSESOPHAGEAL ECHOCARDIOGRAM (TEE) (Esophagus)     Patient location during evaluation: SICU Anesthesia Type: General Level of consciousness: sedated Pain management: pain level controlled Vital Signs Assessment: post-procedure vital signs reviewed and stable Respiratory status: patient remains intubated per anesthesia plan Cardiovascular status: stable Postop Assessment: no apparent nausea or vomiting Anesthetic complications: no   No notable events documented.  Last Vitals:  Vitals:   11/14/21 0945 11/14/21 1000  BP:  97/70  Pulse: 83 83  Resp: 18 13  Temp: 37.6 C 37.5 C  SpO2: 96% 95%    Last Pain:  Vitals:   11/14/21 0952  TempSrc:   PainSc: Asleep                 March Rummage Maysoon Lozada

## 2021-11-15 ENCOUNTER — Telehealth: Payer: Self-pay

## 2021-11-15 ENCOUNTER — Inpatient Hospital Stay (HOSPITAL_COMMUNITY): Payer: PRIVATE HEALTH INSURANCE

## 2021-11-15 LAB — GLUCOSE, CAPILLARY
Glucose-Capillary: 103 mg/dL — ABNORMAL HIGH (ref 70–99)
Glucose-Capillary: 109 mg/dL — ABNORMAL HIGH (ref 70–99)
Glucose-Capillary: 96 mg/dL (ref 70–99)
Glucose-Capillary: 89 mg/dL (ref 70–99)
Glucose-Capillary: 117 mg/dL — ABNORMAL HIGH (ref 70–99)

## 2021-11-15 LAB — CBC
HCT: 28.7 % — ABNORMAL LOW (ref 39.0–52.0)
Hemoglobin: 9.1 g/dL — ABNORMAL LOW (ref 13.0–17.0)
MCH: 31.7 pg (ref 26.0–34.0)
MCHC: 31.7 g/dL (ref 30.0–36.0)
MCV: 100 fL (ref 80.0–100.0)
Platelets: 107 10*3/uL — ABNORMAL LOW (ref 150–400)
RBC: 2.87 MIL/uL — ABNORMAL LOW (ref 4.22–5.81)
RDW: 13 % (ref 11.5–15.5)
WBC: 9.4 10*3/uL (ref 4.0–10.5)
nRBC: 0 % (ref 0.0–0.2)

## 2021-11-15 LAB — BASIC METABOLIC PANEL
Anion gap: 7 (ref 5–15)
BUN: 17 mg/dL (ref 8–23)
CO2: 25 mmol/L (ref 22–32)
Calcium: 8 mg/dL — ABNORMAL LOW (ref 8.9–10.3)
Chloride: 103 mmol/L (ref 98–111)
Creatinine, Ser: 1 mg/dL (ref 0.61–1.24)
GFR, Estimated: >60 mL/min (ref >60)
Glucose, Bld: 94 mg/dL (ref 70–99)
Potassium: 4 mmol/L (ref 3.5–5.1)
Sodium: 135 mmol/L (ref 135–145)

## 2021-11-15 MED ORDER — SODIUM CHLORIDE 0.9 % IV SOLN: 250.0000 mL | INTRAVENOUS | Status: DC | PRN

## 2021-11-15 MED ORDER — FUROSEMIDE 40 MG PO TABS
40.0000 mg | ORAL_TABLET | Freq: Every day | ORAL | Status: DC
  Administered 2021-11-15: 40 mg via ORAL
  Filled 2021-11-15: qty 1

## 2021-11-15 MED ORDER — SODIUM CHLORIDE 0.9% FLUSH: 3.0000 mL | INTRAVENOUS | Status: DC | PRN

## 2021-11-15 MED ORDER — ~~LOC~~ CARDIAC SURGERY, PATIENT & FAMILY EDUCATION: Freq: Once | Status: AC

## 2021-11-15 MED ORDER — SODIUM CHLORIDE 0.9% FLUSH
3.0000 mL | Freq: Two times a day (BID) | INTRAVENOUS | Status: DC
  Administered 2021-11-15: 3 mL via INTRAVENOUS

## 2021-11-15 MED ORDER — POTASSIUM CHLORIDE CRYS ER 20 MEQ PO TBCR
40.0000 meq | EXTENDED_RELEASE_TABLET | Freq: Every day | ORAL | Status: DC
  Administered 2021-11-15 – 2021-11-17 (×3): 40 meq via ORAL
  Filled 2021-11-15 (×3): qty 2

## 2021-11-15 MED FILL — Heparin Sodium (Porcine) Inj 1000 Unit/ML: Qty: 1000 | Status: AC

## 2021-11-15 MED FILL — Magnesium Sulfate Inj 50%: INTRAMUSCULAR | Qty: 10 | Status: AC

## 2021-11-15 MED FILL — Potassium Chloride Inj 2 mEq/ML: INTRAVENOUS | Qty: 40 | Status: AC

## 2021-11-15 NOTE — Progress Notes (Signed)
      [address]       [place] [address]             [phone]      POD # 2 CABG x 4  No issues today  BP (!) 111/96   Pulse 85   Temp 98.3 F (36.8 C) (Oral)   Resp 19   Ht '5\' 8"'$  (1.727 m)   Wt 86.5 kg   SpO2 97%   BMI 29.00 kg/m    Intake/Output Summary (Last 24 hours) at 11/15/2021 1700 Last data filed at 11/15/2021 1600 Gross per 24 hour  Intake 607.6 ml  Output 1838 ml  Net -1230.4 ml    Awaiting bed on 4E  Shantera Monts C. Roxan Hockey, MD [facility] [phone]

## 2021-11-15 NOTE — Progress Notes (Signed)
      [address]       [place] [address]             [phone]                 2 Days Post-Op Procedure(s) (LRB): CORONARY ARTERY BYPASS GRAFTING (CABG) X FOUR BYPASSES USING OPEN LEFT INTERNAL MAMMARY ARTERY AND ENDOSCOPIC RIGHT GREATER SAPHENOUS VEIN HARVEST. (N/A) TRANSESOPHAGEAL ECHOCARDIOGRAM (TEE) (N/A)   Events: No events _______________________________________________________________ Vitals: BP 112/71   Pulse 88   Temp 98.1 F (36.7 C) (Oral)   Resp 15   Ht '5\' 8"'$  (1.727 m)   Wt 86.5 kg   SpO2 99%   BMI 29.00 kg/m  Filed Weights   11/13/21 0644 11/14/21 0500 11/15/21 0500  Weight: 79.4 kg 84.7 kg 86.5 kg     - Neuro: alert NAD  - Cardiovascular: sinus  Drips: none   CVP:  [10 mmHg-19 mmHg] 10 mmHg  - Pulm: EWOB   ABG    Component Value Date/Time   PHART 7.349 (L) 11/13/2021 2142   PCO2ART 41.7 11/13/2021 2142   PO2ART 84 11/13/2021 2142   HCO3 22.8 11/13/2021 2142   TCO2 24 11/13/2021 2142   ACIDBASEDEF 3.0 (H) 11/13/2021 2142   O2SAT 95 11/13/2021 2142    - Abd: ND - Extremity: trace edema  .Intake/Output      07/06 0701 07/07 0700 07/07 0701 07/08 0700   P.O. 120    I.V. (mL/kg) 633 (7.3)    Blood     IV Piggyback 819.9    Total Intake(mL/kg) 1572.9 (18.2)    Urine (mL/kg/hr) 780 (0.4)    Chest Tube 370    Total Output 1150    Net +422.9            _______________________________________________________________ Labs:    Latest Ref Rng & Units 11/15/2021    3:21 AM 11/14/2021    4:35 PM 11/14/2021    3:05 AM  CBC  WBC 4.0 - 10.5 K/uL 9.4  9.7  8.7   Hemoglobin 13.0 - 17.0 g/dL 9.1  9.8  8.9   Hematocrit 39.0 - 52.0 % 28.7  30.3  28.4   Platelets 150 - 400 K/uL 107  125  124       Latest Ref Rng & Units 11/15/2021    3:21 AM 11/14/2021    4:35 PM 11/14/2021    3:05 AM  CMP  Glucose 70 - 99 mg/dL 94  115  108   BUN 8 - 23 mg/dL '17  15  13   '$ Creatinine 0.61 - 1.24 mg/dL 1.00  0.89  0.86   Sodium 135 - 145  mmol/L 135  136  138   Potassium 3.5 - 5.1 mmol/L 4.0  4.1  4.2   Chloride 98 - 111 mmol/L 103  103  108   CO2 22 - 32 mmol/L '25  25  23   '$ Calcium 8.9 - 10.3 mg/dL 8.0  7.9  7.8     CXR: clear  _______________________________________________________________  Assessment and Plan: POD 2 s/p CABG  Neuro: pain controlled CV: on A/S/BB.   Pulm: IS, ambulation.  Will remove Cts today Renal: creat stable.  diuresing GI: on diet Heme: stable ID: afebrile Endo: SSI Dispo: floor   Mark [name] 11/15/2021 7:54 AM

## 2021-11-15 NOTE — Progress Notes (Signed)
Pt arrived to 4E from [place]. CHG done. VSS. A&Ox4. Skin c/d/I. Wife at bedside. Pt oriented to room and call light in reach.  Raelyn Number, RN

## 2021-11-15 NOTE — Progress Notes (Signed)
CARDIAC REHAB PHASE I   PRE:  Rate/Rhythm: 94 SR    BP: sitting 111/62    SaO2: 98 2L, 87 RA  MODE:  Ambulation: 370 ft   POST:  Rate/Rhythm: 102 ST    BP: sitting 129/74     SaO2: 98 2L  Pt moved out of bed with min assist and verbal cues. Stood independently. Walked with RW and 2L (had attempted RA but desat). Steady, some SOB with rest x2. To recliner, VSS. Practiced IS, 750 ml. Encouraged another walk (x3 a day). [place], ACSM 11/15/2021 3:03 PM

## 2021-11-15 NOTE — Telephone Encounter (Signed)
FMLA form completed and faxed to [phone]/[employer]. LOA begins 11/13/21 through approx 02/10/22/ forms mailed to home address.

## 2021-11-16 LAB — BASIC METABOLIC PANEL
Anion gap: 6 (ref 5–15)
BUN: 13 mg/dL (ref 8–23)
CO2: 26 mmol/L (ref 22–32)
Calcium: 8.1 mg/dL — ABNORMAL LOW (ref 8.9–10.3)
Chloride: 108 mmol/L (ref 98–111)
Creatinine, Ser: 1.01 mg/dL (ref 0.61–1.24)
GFR, Estimated: >60 mL/min (ref >60)
Glucose, Bld: 102 mg/dL — ABNORMAL HIGH (ref 70–99)
Potassium: 4.1 mmol/L (ref 3.5–5.1)
Sodium: 140 mmol/L (ref 135–145)

## 2021-11-16 LAB — CBC
HCT: 29 % — ABNORMAL LOW (ref 39.0–52.0)
Hemoglobin: 9.5 g/dL — ABNORMAL LOW (ref 13.0–17.0)
MCH: 32 pg (ref 26.0–34.0)
MCHC: 32.8 g/dL (ref 30.0–36.0)
MCV: 97.6 fL (ref 80.0–100.0)
Platelets: 116 10*3/uL — ABNORMAL LOW (ref 150–400)
RBC: 2.97 MIL/uL — ABNORMAL LOW (ref 4.22–5.81)
RDW: 12.9 % (ref 11.5–15.5)
WBC: 9.6 10*3/uL (ref 4.0–10.5)
nRBC: 0 % (ref 0.0–0.2)

## 2021-11-16 LAB — GLUCOSE, CAPILLARY
Glucose-Capillary: 101 mg/dL — ABNORMAL HIGH (ref 70–99)
Glucose-Capillary: 105 mg/dL — ABNORMAL HIGH (ref 70–99)
Glucose-Capillary: 92 mg/dL (ref 70–99)

## 2021-11-16 MED ORDER — METOPROLOL TARTRATE 25 MG PO TABS
25.0000 mg | ORAL_TABLET | Freq: Two times a day (BID) | ORAL | Status: DC
  Administered 2021-11-16 – 2021-11-17 (×3): 25 mg via ORAL
  Filled 2021-11-16 (×3): qty 1

## 2021-11-16 MED ORDER — METOPROLOL TARTRATE 25 MG/10 ML ORAL SUSPENSION
25.0000 mg | Freq: Two times a day (BID) | ORAL | Status: DC
  Filled 2021-11-16 (×4): qty 10

## 2021-11-16 MED ORDER — FUROSEMIDE 10 MG/ML IJ SOLN
40.0000 mg | Freq: Once | INTRAMUSCULAR | Status: AC
  Administered 2021-11-16: 40 mg via INTRAVENOUS
  Filled 2021-11-16: qty 4

## 2021-11-16 NOTE — Progress Notes (Addendum)
      [address]       [place] [address]             [phone]      3 Days Post-Op Procedure(s) (LRB): CORONARY ARTERY BYPASS GRAFTING (CABG) X FOUR BYPASSES USING OPEN LEFT INTERNAL MAMMARY ARTERY AND ENDOSCOPIC RIGHT GREATER SAPHENOUS VEIN HARVEST. (N/A) TRANSESOPHAGEAL ECHOCARDIOGRAM (TEE) (N/A)  Subjective:  Patient didn't sleep well.  He has a hard time not being busy and doing things.  He is ambulating independently in the hallway.  He has moved his bowels, which was loose due to stool softeners.  Objective: Vital signs in last 24 hours: Temp:  [97.5 F (36.4 C)-98.8 F (37.1 C)] 97.5 F (36.4 C) (07/08 0721) Pulse Rate:  [78-100] 92 (07/08 0721) Cardiac Rhythm: Normal sinus rhythm (07/07 1958) Resp:  [14-20] 16 (07/08 0721) BP: (105-143)/(67-96) 127/87 (07/08 0721) SpO2:  [94 %-100 %] 94 % (07/08 0721) Weight:  [83 kg] 83 kg (07/08 0832)  Intake/Output from previous day: 07/07 0701 - 07/08 0700 In: 20 [I.V.:20] Out: 1053 [Urine:1051; Stool:2]  General appearance: alert, cooperative, and no distress Heart: regular rate and rhythm Lungs: clear to auscultation bilaterally Abdomen: soft, non-tender; bowel sounds normal; no masses,  no organomegaly Extremities: edema mild Wound: clean and dry  Lab Results: Recent Labs    11/15/21 0321 11/16/21 0044  WBC 9.4 9.6  HGB 9.1* 9.5*  HCT 28.7* 29.0*  PLT 107* 116*   BMET:  Recent Labs    11/15/21 0321 11/16/21 0044  NA 135 140  K 4.0 4.1  CL 103 108  CO2 25 26  GLUCOSE 94 102*  BUN 17 13  CREATININE 1.00 1.01  CALCIUM 8.0* 8.1*    PT/INR:  Recent Labs    11/13/21 1329  LABPROT 16.2*  INR 1.3*   ABG    Component Value Date/Time   PHART 7.349 (L) 11/13/2021 2142   HCO3 22.8 11/13/2021 2142   TCO2 24 11/13/2021 2142   ACIDBASEDEF 3.0 (H) 11/13/2021 2142   O2SAT 95 11/13/2021 2142   CBG (last 3)  Recent Labs    11/16/21 0014 11/16/21 0405 11/16/21 0723  GLUCAP 101* 92  105*    Assessment/Plan: S/P Procedure(s) (LRB): CORONARY ARTERY BYPASS GRAFTING (CABG) X FOUR BYPASSES USING OPEN LEFT INTERNAL MAMMARY ARTERY AND ENDOSCOPIC RIGHT GREATER SAPHENOUS VEIN HARVEST. (N/A) TRANSESOPHAGEAL ECHOCARDIOGRAM (TEE) (N/A)  CV- NSR, BP elevated at times- will increase Lopressor to 25 mg BID Pulm- weaning oxygen as tolerated, continue IS Renal- creatinine normal at 1.0, weight remains elevated, will give IV Lasix today, supplement potassium Expected post operative blood loss anemia, Hgb stable at 9.5 CBGs controlled- patient not a diabetic, will d/c SSIP DIspo- patient stable, titrate Lopressor, diurese, stop all stool softeners, if remains stable for d/c in AM   LOS: 3 days   Ellwood Handler, PA-C 11/16/2021  Patient seen and examined, agree with above   Remo Lipps C. Roxan Hockey, MD [facility] [phone]

## 2021-11-16 NOTE — Progress Notes (Signed)
CARDIAC REHAB PHASE I   PRE:  Rate/Rhythm: Sinus rhythm  BP:    Sitting: 122/85     SaO2: 95% RA  MODE:  Ambulation: 450 ft   POST:  Rate/Rhythem: 95 sinus Rhythm  BP:  Supine: 112/82     SaO2: 96% on RA  1120-1223 Patient walked independently using rolling walker without difficulty. Stopped patient times 4 to rest and to check oxygen saturations. SAO2 remained between 91-95% on room air during ambulation. Patient assisted to bed with call bell within reach. Discussed continued use of incentive spirometer, sternal precautions, Exercise guidelines, temperature precautions. Liliane Channel said he is okay with [facility] contacting him about phase 2 cardiac rehab. Reviewed heart healthy diet with the patient. Set up discharge open heart surgery for patient and wife to view.   Harrell Gave

## 2021-11-17 MED ORDER — TRAMADOL HCL 50 MG PO TABS
50.0000 mg | ORAL_TABLET | ORAL | 0 refills | Status: DC | PRN
Start: 2021-11-17 — End: 2021-12-06

## 2021-11-17 MED ORDER — FUROSEMIDE 40 MG PO TABS: 40.0000 mg | ORAL_TABLET | Freq: Every day | ORAL | 0 refills | Status: DC

## 2021-11-17 MED ORDER — ACETAMINOPHEN 500 MG PO TABS: 500.0000 mg | ORAL_TABLET | Freq: Four times a day (QID) | ORAL | 0 refills | Status: DC | PRN

## 2021-11-17 MED ORDER — ASPIRIN 325 MG PO TBEC: 325.0000 mg | DELAYED_RELEASE_TABLET | Freq: Every day | ORAL | Status: DC

## 2021-11-17 MED ORDER — POTASSIUM CHLORIDE CRYS ER 20 MEQ PO TBCR: 20.0000 meq | EXTENDED_RELEASE_TABLET | Freq: Every day | ORAL | 0 refills | Status: DC

## 2021-11-17 NOTE — Plan of Care (Signed)
  Problem: Education: Goal: Understanding of CV disease, CV risk reduction, and recovery process will improve Outcome: Adequate for Discharge Goal: Individualized Educational Video(s) Outcome: Adequate for Discharge   Problem: Activity: Goal: Ability to return to baseline activity level will improve Outcome: Adequate for Discharge   Problem: Cardiovascular: Goal: Ability to achieve and maintain adequate cardiovascular perfusion will improve Outcome: Adequate for Discharge Goal: Vascular access site(s) Level 0-1 will be maintained Outcome: Adequate for Discharge   Problem: Health Behavior/Discharge Planning: Goal: Ability to safely manage health-related needs after discharge will improve Outcome: Adequate for Discharge   Problem: Education: Goal: Knowledge of General Education information will improve Description: Including pain rating scale, medication(s)/side effects and non-pharmacologic comfort measures Outcome: Adequate for Discharge   Problem: Health Behavior/Discharge Planning: Goal: Ability to manage health-related needs will improve Outcome: Adequate for Discharge   Problem: Clinical Measurements: Goal: Ability to maintain clinical measurements within normal limits will improve Outcome: Adequate for Discharge Goal: Will remain free from infection Outcome: Adequate for Discharge Goal: Diagnostic test results will improve Outcome: Adequate for Discharge Goal: Respiratory complications will improve Outcome: Adequate for Discharge Goal: Cardiovascular complication will be avoided Outcome: Adequate for Discharge   Problem: Activity: Goal: Risk for activity intolerance will decrease Outcome: Adequate for Discharge   Problem: Nutrition: Goal: Adequate nutrition will be maintained Outcome: Adequate for Discharge   Problem: Coping: Goal: Level of anxiety will decrease Outcome: Adequate for Discharge   Problem: Elimination: Goal: Will not experience complications  related to bowel motility Outcome: Adequate for Discharge Goal: Will not experience complications related to urinary retention Outcome: Adequate for Discharge   Problem: Pain Managment: Goal: General experience of comfort will improve Outcome: Adequate for Discharge   Problem: Safety: Goal: Ability to remain free from injury will improve Outcome: Adequate for Discharge   Problem: Skin Integrity: Goal: Risk for impaired skin integrity will decrease Outcome: Adequate for Discharge   Problem: Education: Goal: Will demonstrate proper wound care and an understanding of methods to prevent future damage Outcome: Adequate for Discharge Goal: Knowledge of disease or condition will improve Outcome: Adequate for Discharge Goal: Knowledge of the prescribed therapeutic regimen will improve Outcome: Adequate for Discharge Goal: Individualized Educational Video(s) Outcome: Adequate for Discharge   Problem: Activity: Goal: Risk for activity intolerance will decrease Outcome: Adequate for Discharge   Problem: Cardiac: Goal: Will achieve and/or maintain hemodynamic stability Outcome: Adequate for Discharge   Problem: Clinical Measurements: Goal: Postoperative complications will be avoided or minimized Outcome: Adequate for Discharge   Problem: Respiratory: Goal: Respiratory status will improve Outcome: Adequate for Discharge   Problem: Skin Integrity: Goal: Wound healing without signs and symptoms of infection Outcome: Adequate for Discharge Goal: Risk for impaired skin integrity will decrease Outcome: Adequate for Discharge   Problem: Urinary Elimination: Goal: Ability to achieve and maintain adequate renal perfusion and functioning will improve Outcome: Adequate for Discharge   

## 2021-11-17 NOTE — Progress Notes (Addendum)
      [address]       [place] [address]             [phone]      4 Days Post-Op Procedure(s) (LRB): CORONARY ARTERY BYPASS GRAFTING (CABG) X FOUR BYPASSES USING OPEN LEFT INTERNAL MAMMARY ARTERY AND ENDOSCOPIC RIGHT GREATER SAPHENOUS VEIN HARVEST. (N/A) TRANSESOPHAGEAL ECHOCARDIOGRAM (TEE) (N/A)  Subjective:  Patient feels great.  He really wants to go home.  He is not sleeping here and really cant stand another night in the hospital bed.  He is ambulating independently.  + BM  Objective: Vital signs in last 24 hours: Temp:  [98.1 F (36.7 C)-98.8 F (37.1 C)] 98.6 F (37 C) (07/09 0422) Pulse Rate:  [87-99] 89 (07/09 0422) Cardiac Rhythm: Normal sinus rhythm (07/08 2018) Resp:  [17-20] 17 (07/09 0422) BP: (116-132)/(75-99) 125/99 (07/09 0422) SpO2:  [90 %-94 %] 94 % (07/09 0422) Weight:  [80.9 kg] 80.9 kg (07/09 0422)  Intake/Output from previous day: 07/08 0701 - 07/09 0700 In: -  Out: 1 [Urine:1]  General appearance: alert, cooperative, and no distress Heart: regular rate and rhythm and tachy Lungs: clear to auscultation bilaterally Abdomen: soft, non-tender; bowel sounds normal; no masses,  no organomegaly Extremities: edema trace Wound: clean and dry  Lab Results: Recent Labs    11/15/21 0321 11/16/21 0044  WBC 9.4 9.6  HGB 9.1* 9.5*  HCT 28.7* 29.0*  PLT 107* 116*   BMET:  Recent Labs    11/15/21 0321 11/16/21 0044  NA 135 140  K 4.0 4.1  CL 103 108  CO2 25 26  GLUCOSE 94 102*  BUN 17 13  CREATININE 1.00 1.01  CALCIUM 8.0* 8.1*    PT/INR: No results for input(s): "LABPROT", "INR" in the last 72 hours. ABG    Component Value Date/Time   PHART 7.349 (L) 11/13/2021 2142   HCO3 22.8 11/13/2021 2142   TCO2 24 11/13/2021 2142   ACIDBASEDEF 3.0 (H) 11/13/2021 2142   O2SAT 95 11/13/2021 2142   CBG (last 3)  Recent Labs    11/16/21 0014 11/16/21 0405 11/16/21 0723  GLUCAP 101* 92 105*    Assessment/Plan: S/P  Procedure(s) (LRB): CORONARY ARTERY BYPASS GRAFTING (CABG) X FOUR BYPASSES USING OPEN LEFT INTERNAL MAMMARY ARTERY AND ENDOSCOPIC RIGHT GREATER SAPHENOUS VEIN HARVEST. (N/A) TRANSESOPHAGEAL ECHOCARDIOGRAM (TEE) (N/A)  CV- NSR with PVCs, BP stable- will continue Lopressor at 25 mg BID Pulm- off oxygen, continue IS Renal- creatinine remains stable, weight trending down after IV diuretics, will taper with oral lasix over next several days, supplement K Dispo- patient stable, will d/c home today   LOS: 4 days    Ellwood Handler, PA-C 11/17/2021  Patient seen and examined, agree with above Ready for [place] C. Roxan Hockey, MD [facility] [phone]

## 2021-11-17 NOTE — Progress Notes (Signed)
Pt to be d/c from 4E to home with wife. D/c info and medication education provided. Tele and IV removed.   Raelyn Number, RN

## 2021-11-19 ENCOUNTER — Telehealth: Payer: Self-pay | Admitting: *Deleted

## 2021-11-19 NOTE — Patient Outreach (Deleted)
  Care Coordination TOC Note Transition Care Management Follow-up Telephone Call Date of discharge and from where: 11/17/21 [facility] How have you been since you were released from the hospital? I am still a little short winded and sore but doing better with my walking up to 5 minutes 5 times a day. My incision looks good and I understand to contact my providers for any concerns or questions. Any questions or concerns? No  Items Reviewed: Did the pt receive and understand the discharge instructions provided? Yes  Medications obtained and verified? Yes  Other? No  Any new allergies since your discharge? No  Dietary orders reviewed? Yes Do you have support at home? Yes   Home Care and Equipment/Supplies: Were home health services ordered? no If so, what is the name of the agency? N/A  Has the agency set up a time to come to the patient's home? not applicable Were any new equipment or medical supplies ordered?  No What is the name of the medical supply agency? N/A Were you able to get the supplies/equipment? not applicable Do you have any questions related to the use of the equipment or supplies? No  Functional Questionnaire: (I = Independent and D = Dependent) ADLs: I  Bathing/Dressing- I  Meal Prep- wife assist  Eating- I  Maintaining continence- I  Transferring/Ambulation- I  Managing Meds- I  Follow up appointments reviewed:  PCP Hospital f/u appt confirmed? No   Specialist Hospital f/u appt confirmed? Yes  Scheduled to see Dr. Kipp Brood on 11/29/21 @ 3:10 and Coletta Memos, NP 12/06/21 @ 8:25. Are transportation arrangements needed? No  If their condition worsens, is the pt aware to call PCP or go to the Emergency Dept.? Yes Was the patient provided with contact information for the PCP's office or ED? Yes Was to pt encouraged to call back with questions or concerns? Yes  SDOH assessments and interventions completed:   Yes  Care Coordination Interventions Activated:   No Care Coordination Interventions:   N/A  Encounter Outcome:  Pt. Visit Completed  Emelia Loron RN, BSN [place] [phone] [email or web address]

## 2021-11-19 NOTE — Telephone Encounter (Signed)
Successful TOC call made to patient

## 2021-11-19 NOTE — Patient Outreach (Signed)
[facility]) Care Management  11/19/2021  Mark [name] 04/17/1961 [id number]  Transition Care Management Follow-up Telephone Call Date of discharge and from where: 11/17/21 [facility] How have you been since you were released from the hospital? I am still a little short winded and sore but doing better with my walking up to 5 minutes 5 times a day. My incision looks good and I understand to contact my providers for any concerns or questions. Any questions or concerns? No   Items Reviewed: Did the pt receive and understand the discharge instructions provided? Yes  Medications obtained and verified? Yes  Other? No  Any new allergies since your discharge? No  Dietary orders reviewed? Yes Do you have support at home? Yes    Home Care and Equipment/Supplies: Were home health services ordered? no If so, what is the name of the agency? N/A  Has the agency set up a time to come to the patient's home? not applicable Were any new equipment or medical supplies ordered?  No What is the name of the medical supply agency? N/A Were you able to get the supplies/equipment? not applicable Do you have any questions related to the use of the equipment or supplies? No   Functional Questionnaire: (I = Independent and D = Dependent) ADLs: I   Bathing/Dressing- I   Meal Prep- wife assist   Eating- I   Maintaining continence- I   Transferring/Ambulation- I   Managing Meds- I   Follow up appointments reviewed:   PCP Hospital f/u appt confirmed? No   Specialist Hospital f/u appt confirmed? Yes  Scheduled to see Dr. Kipp Brood on 11/29/21 @ 3:10 and Coletta Memos, NP 12/06/21 @ 8:25. Are transportation arrangements needed? No  If their condition worsens, is the pt aware to call PCP or go to the Emergency Dept.? Yes Was the patient provided with contact information for the PCP's office or ED? Yes Was to pt encouraged to call back with questions or concerns? Yes   SDOH  assessments and interventions completed:   Yes   Care Coordination Interventions Activated:  No Care Coordination Interventions:   N/A   Encounter Outcome:  Pt. Visit Completed   Emelia Loron RN, BSN [place] [phone] [name]

## 2021-11-20 MED FILL — Mannitol IV Soln 20%: INTRAVENOUS | Qty: 500 | Status: AC

## 2021-11-20 MED FILL — Electrolyte-R (PH 7.4) Solution: INTRAVENOUS | Qty: 4000 | Status: AC

## 2021-11-20 MED FILL — Heparin Sodium (Porcine) Inj 1000 Unit/ML: INTRAMUSCULAR | Qty: 20 | Status: AC

## 2021-11-20 MED FILL — Sodium Bicarbonate IV Soln 8.4%: INTRAVENOUS | Qty: 50 | Status: AC

## 2021-11-20 MED FILL — Calcium Chloride Inj 10%: INTRAVENOUS | Qty: 10 | Status: AC

## 2021-11-20 MED FILL — Sodium Chloride IV Soln 0.9%: INTRAVENOUS | Qty: 2000 | Status: AC

## 2021-11-25 LAB — ECHO INTRAOPERATIVE TEE
AR max vel: 1.88 cm2
AV Area VTI: 1.8 cm2
AV Area mean vel: 1.92 cm2
AV Mean grad: 3 mmHg
AV Peak grad: 4.8 mmHg
Ao pk vel: 1.1 m/s
Area-P 1/2: 3.33 cm2
Height: 68 in
MV VTI: 2.5 cm2
Weight: 2800 oz

## 2021-11-29 ENCOUNTER — Ambulatory Visit (INDEPENDENT_AMBULATORY_CARE_PROVIDER_SITE_OTHER): Payer: Self-pay | Admitting: Thoracic Surgery (Cardiothoracic Vascular Surgery)

## 2021-11-29 ENCOUNTER — Telehealth: Payer: PRIVATE HEALTH INSURANCE | Admitting: Thoracic Surgery (Cardiothoracic Vascular Surgery)

## 2021-11-29 DIAGNOSIS — Z951 Presence of aortocoronary bypass graft: Secondary | ICD-10-CM

## 2021-11-29 NOTE — Progress Notes (Signed)
     [address]       [place] [address]             [phone]       Patient: Home Provider: Office Consent for Telemedicine visit obtained.  Today's visit was completed via a real-time telehealth (see specific modality noted below). The patient/authorized person provided oral consent at the time of the visit to engage in a telemedicine encounter with the present provider at [facility]. The patient/authorized person was informed of the potential benefits, limitations, and risks of telemedicine. The patient/authorized person expressed understanding that the laws that protect confidentiality also apply to telemedicine. The patient/authorized person acknowledged understanding that telemedicine does not provide emergency services and that he or she would need to call 911 or proceed to the nearest hospital for help if such a need arose.   Total time spent in the clinical discussion 10 minutes.  Telehealth Modality: Phone visit (audio only)  I had a telephone visit with  Mark [name]. who is s/p CABG.  Overall doing well.   Pain is minimal.  Ambulating well. Vitals have been stable.  Mark [name]. will see [place] back in 1 month with a chest x-ray for cardiac rehab clearance.  Mark [name]

## 2021-12-01 ENCOUNTER — Other Ambulatory Visit: Payer: Self-pay | Admitting: Nurse Practitioner

## 2021-12-01 DIAGNOSIS — K219 Gastro-esophageal reflux disease without esophagitis: Secondary | ICD-10-CM

## 2021-12-04 NOTE — Progress Notes (Signed)
Cardiology Clinic Note   Patient Name: Mark [name]. Date of Encounter: 12/06/2021  Primary Care Provider:  Kathyrn Drown, MD Primary Cardiologist:  Janina Mayo, MD  Patient Profile    Mark [name]. 61 year old male presents the clinic today for follow-up evaluation of his coronary artery disease status post CABG x4 on 11/13/2021  Past Medical History    Past Medical History:  Diagnosis Date   Cancer ([place]) 15-20 years ago   squamous cell carcinoma off face   Coronary artery disease    COVID 04/2019   tested positive but no symptoms   GERD (gastroesophageal reflux disease)    Hypertension    Past Surgical History:  Procedure Laterality Date   BACK SURGERY     CATARACT EXTRACTION Left    COLONOSCOPY  03/19/2011   Procedure: COLONOSCOPY;  Surgeon: Rogene Houston, MD;  [facility];  Service: Endoscopy;  Laterality: N/A;   CORONARY ARTERY BYPASS GRAFT N/A 11/13/2021   Procedure: CORONARY ARTERY BYPASS GRAFTING (CABG) X FOUR BYPASSES USING OPEN LEFT INTERNAL MAMMARY ARTERY AND ENDOSCOPIC RIGHT GREATER SAPHENOUS VEIN HARVEST.;  Surgeon: Lajuana Matte, MD;  Location: [facility];  Service: Open Heart Surgery;  Laterality: N/A;   EYE SURGERY Left    for hole in eye   LEFT HEART CATH AND CORONARY ANGIOGRAPHY N/A 10/30/2021   Procedure: LEFT HEART CATH AND CORONARY ANGIOGRAPHY;  Surgeon: Sherren Mocha, MD;  Location: [facility];  Service: Cardiovascular;  Laterality: N/A;   SHOULDER SURGERY Left    skin cancer removed from face     TEE WITHOUT CARDIOVERSION N/A 11/13/2021   Procedure: TRANSESOPHAGEAL ECHOCARDIOGRAM (TEE);  Surgeon: Lajuana Matte, MD;  Location: [facility];  Service: Open Heart Surgery;  Laterality: N/A;   UMBILICAL HERNIA REPAIR N/A 09/15/2014   Procedure: HERNIA REPAIR UMBILICAL ADULT;  Surgeon: III Dia Crawford, MD;  Location: [facility];  Service: General;  Laterality: N/A;   VASECTOMY      Allergies  No Known  Allergies  History of Present Illness    [name]. is a PMH of coronary artery disease, HTN, HLD, left shoulder injury, GERD, and is status post CABG x4 on 11/13/2021.  He was noted to have increased shortness of breath and chest discomfort.  He noted an episode of worsening symptoms while doing yard work.  He also reported being more short of breath with ambulation.  He underwent stress testing and May which was felt to be low risk.  He was noted to have some T wave changes with stress.  He was seen by Dr. Harl Bowie on 10/29/2021 who recommended cardiac catheterization.  This was performed on 10/30/2021 which showed multivessel coronary disease.  CT TS was consulted and Dr. Kipp Brood felt he would benefit from CABG.  He [facility] to [facility]2021 underwent CABG x4 (LIMA-LAD, SVG-OM, SVG-diagonal and SVG-PAV.).  He tolerated the surgery well and progressed postoperatively without difficulty.  He did require diuretics for fluid volume overload.  He maintained normal sinus rhythm and was discharged on 11/17/2021.  He presents to the clinic today for follow-up evaluation states he feels well.  He has been slowly increasing his physical activity and is now up to 1.5 miles per day walking.  He reports that he enjoys walking on his driveway in the morning.  He has both rolling sections and flat sections.  We reviewed his recent open heart surgery and previous cardiac  catheterization.  He and his wife expressed understanding.  We reviewed sternal precautions.  I will order repeat lipids in 2 to 4 weeks.  We will continue his current medication regimen, have him maintain/increase his physical activity as tolerated and follow-up with Dr. Harl Bowie as scheduled.  Today he denies chest pain, shortness of breath, lower extremity edema, fatigue, palpitations, melena, hematuria, hemoptysis, diaphoresis, weakness, presyncope, syncope, orthopnea, and PND.    Home Medications    Prior to Admission  medications   Medication Sig Start Date End Date Taking? Authorizing Provider  acetaminophen (TYLENOL) 500 MG tablet Take 1-2 tablets (500-1,000 mg total) by mouth every 6 (six) hours as needed. 11/17/21   Barrett, Erin R, PA-C  albuterol (VENTOLIN HFA) 108 (90 Base) MCG/ACT inhaler Inhale 2 puffs into the lungs every 4 (four) hours as needed for wheezing or shortness of breath. 10/03/21   Spero Geralds, MD  aspirin EC 325 MG tablet Take 1 tablet (325 mg total) by mouth daily. 11/17/21   Barrett, Erin R, PA-C  famotidine (PEPCID) 20 MG tablet TAKE 1 TABLET BY MOUTH TWICE A DAY 12/02/21   Kathyrn Drown, MD  furosemide (LASIX) 40 MG tablet Take 1 tablet (40 mg total) by mouth daily. 11/17/21 11/17/22  Barrett, Erin R, PA-C  metoprolol tartrate (LOPRESSOR) 25 MG tablet Take 1 tablet (25 mg total) by mouth 2 (two) times daily. 10/30/21 10/30/22  Sherren Mocha, MD  Multiple Vitamins-Minerals (MULTIVITAMINS THER. W/MINERALS) TABS Take 1 tablet by mouth daily.    [provider]  Multiple Vitamins-Minerals (PRESERVISION AREDS PO) Take 1 Capful by mouth in the morning and at bedtime.    [provider]  potassium chloride SA (KLOR-CON M) 20 MEQ tablet Take 1 tablet (20 mEq total) by mouth daily. 11/17/21   Barrett, Erin R, PA-C  rosuvastatin (CRESTOR) 40 MG tablet Take 1 tablet (40 mg total) by mouth daily. 09/19/21   Kathyrn Drown, MD  traMADol (ULTRAM) 50 MG tablet Take 1 tablet (50 mg total) by mouth every 4 (four) hours as needed for moderate pain. 11/17/21   Barrett, Lodema Hong, PA-C    Family History    Family History  Problem Relation Age of Onset   Heart disease Father    Pulmonary fibrosis Father    Colon cancer Neg Hx    He indicated that the status of his father is unknown. He indicated that the status of his neg hx is unknown.  Social History    Social History   Socioeconomic History   Marital status: Married    Spouse name: Not on file   Number of children: Not on file    Years of education: Not on file   Highest education level: Not on file  Occupational History   Not on file  Tobacco Use   Smoking status: Former    Packs/day: 1.00    Years: 15.00    Total pack years: 15.00    Types: Cigarettes    Start date: 4    Quit date: 09/04/1996    Years since quitting: 25.2   Smokeless tobacco: Never  Vaping Use   Vaping Use: Not on file  Substance and Sexual Activity   Alcohol use: Yes    Comment: Rarely   Drug use: No   Sexual activity: Yes  Other Topics Concern   Not on file  Social History Narrative   ** Merged History Encounter **       Social Determinants of Health  Financial Resource Strain: Not on file  Food Insecurity: Not on file  Transportation Needs: Not on file  Physical Activity: Not on file  Stress: Not on file  Social Connections: Not on file  Intimate Partner Violence: Not on file     Review of Systems    General:  No chills, fever, night sweats or weight changes.  Cardiovascular:  No chest pain, dyspnea on exertion, edema, orthopnea, palpitations, paroxysmal nocturnal dyspnea. Dermatological: No rash, lesions/masses Respiratory: No cough, dyspnea Urologic: No hematuria, dysuria Abdominal:   No nausea, vomiting, diarrhea, bright red blood per rectum, melena, or hematemesis Neurologic:  No visual changes, wkns, changes in mental status. All other systems reviewed and are otherwise negative except as noted above.  Physical Exam    VS:  BP 120/72   Pulse 75   Ht '5\' 7"'$  (1.702 m)   Wt 175 lb (79.4 kg)   SpO2 97%   BMI 27.41 kg/m  , BMI Body mass index is 27.41 kg/m. GEN: Well nourished, well developed, in no acute distress. HEENT: normal. Neck: Supple, no JVD, carotid bruits, or masses. Cardiac: RRR, no murmurs, rubs, or gallops. No clubbing, cyanosis, edema.  Radials/DP/PT 2+ and equal bilaterally.  Respiratory:  Respirations regular and unlabored, clear to auscultation bilaterally. GI: Soft, nontender,  nondistended, BS + x 4. MS: no deformity or atrophy. Skin: warm and dry, no rash.  Sternal incision healing well no signs of infection.  Chest tube sites healing well  no signs of infection Neuro:  Strength and sensation are intact. Psych: Normal affect.  Accessory Clinical Findings    Recent Labs: 10/02/2021: BNP 18.2 11/11/2021: ALT 38 11/14/2021: Magnesium 2.4 11/16/2021: BUN 13; Creatinine, Ser 1.01; Hemoglobin 9.5; Platelets 116; Potassium 4.1; Sodium 140   Recent Lipid Panel    Component Value Date/Time   CHOL 177 09/18/2021 1029   TRIG 53 09/18/2021 1029   HDL 75 09/18/2021 1029   CHOLHDL 2.4 09/18/2021 1029   CHOLHDL 3.0 07/02/2021 0830   VLDL 13 07/14/2013 0704   LDLCALC 91 09/18/2021 1029   LDLCALC 93 07/02/2021 0830    ECG personally reviewed by me today-normal sinus rhythm no ST or T wave deviation 75 bpm- No acute changes  Echocardiogram 10/11/2021 IMPRESSIONS     1. Left ventricular ejection fraction, by estimation, is 60 to 65%. The  left ventricle has normal function. The left ventricle has no regional  wall motion abnormalities. There is mild left ventricular hypertrophy.  Left ventricular diastolic parameters  are consistent with Grade I diastolic dysfunction (impaired relaxation).   2. Right ventricular systolic function is normal. The right ventricular  size is normal. Tricuspid regurgitation signal is inadequate for assessing  PA pressure.   3. The mitral valve is normal in structure. No evidence of mitral valve  regurgitation. No evidence of mitral stenosis.   4. The aortic valve is tricuspid. Aortic valve regurgitation is trivial.  Aortic valve sclerosis is present, with no evidence of aortic valve  stenosis.   5. The inferior vena cava is normal in size with greater than 50%  respiratory variability, suggesting right atrial pressure of 3 mmHg.  Cardiac catheterization 10/30/2021 1.  Patent left main stem with no significant stenosis 2.  Severe 70 to  75% proximal LAD stenosis with critical 95% first diagonal stenosis 3.  Moderate stenosis in the mid circumflex into the second OM and severe 90% stenosis of the first OM 4.  Nonobstructive RCA stenosis with severe 95% stenosis of the  first posterolateral branch (largest of the distal RCA branch vessels)   Recommend outpatient cardiac surgical consultation for consideration of CABG.  PCI could also be performed in this patient with a low syntax score but he would require multivessel stenting of the LAD, first diagonal, first obtuse marginal, and first posterolateral branch of the RCA.  I think he would likely do best long-term with multivessel CABG.  We will start metoprolol 25 mg twice daily and he will otherwise continue on his current medications.  Diagnostic Dominance: Right  Intervention   Assessment & Plan   1.  Coronary artery disease-status post CABG x4 on 11/13/2021. (LIMA-LAD, SVG-OM, SVG-diagonal and SVG-PAV.)  Slowly progressing physical activity and maintain sternal precautions. Continue with aspirin, furosemide, potassium, rosuvastatin, metoprolol Heart healthy low-sodium diet-salty 6 given Increase physical activity as tolerated  Hyperlipidemia-LDL 91 on 09/18/21 Continue aspirin, rosuvastatin Heart healthy low-sodium high-fiber diet Increase physical activity as tolerated Repeat fasting lipids and LFT's in 2-4 weeks-we will have labs at [facility] in [place] hypertension-BP today 120/72.  Well-controlled at home. Continue metoprolol Heart healthy low-sodium diet-salty 6 given Increase physical activity as tolerated  GERD-denies reflux type symptoms.  Compliant with famotidine GERD diet instructions Continue famotidine Follows with PCP  Disposition: Follow-up with Dr. Harl Bowie in 3-4 months.    Jossie Ng. Atira Borello NP-C     12/06/2021, 8:38 AM [place] [address] Office [phone] Fax [phone]  Notice:  This dictation was prepared with Dragon dictation along with smaller phrase technology. Any transcriptional errors that result from this process are unintentional and may not be corrected upon review.  I spent 13 minutes examining this patient, reviewing medications, and using patient centered shared decision making involving her cardiac care.  Prior to her visit I spent greater than 20 minutes reviewing her past medical history,  medications, and prior cardiac tests.

## 2021-12-06 ENCOUNTER — Encounter: Payer: Self-pay | Admitting: General Practice

## 2021-12-06 ENCOUNTER — Ambulatory Visit (INDEPENDENT_AMBULATORY_CARE_PROVIDER_SITE_OTHER): Payer: PRIVATE HEALTH INSURANCE | Admitting: General Practice

## 2021-12-06 VITALS — BP 120/72 | HR 75 | Ht 67.0 in | Wt 175.0 lb

## 2021-12-06 DIAGNOSIS — I10 Essential (primary) hypertension: Secondary | ICD-10-CM

## 2021-12-06 DIAGNOSIS — K219 Gastro-esophageal reflux disease without esophagitis: Secondary | ICD-10-CM

## 2021-12-06 DIAGNOSIS — Z951 Presence of aortocoronary bypass graft: Secondary | ICD-10-CM | POA: Diagnosis not present

## 2021-12-06 DIAGNOSIS — E782 Mixed hyperlipidemia: Secondary | ICD-10-CM

## 2021-12-06 DIAGNOSIS — I2510 Atherosclerotic heart disease of native coronary artery without angina pectoris: Secondary | ICD-10-CM

## 2021-12-06 NOTE — Patient Instructions (Signed)
Medication Instructions:  The current medical regimen is effective;  continue present plan and medications as directed. Please refer to the Current Medication list given to you today.   *If you need a refill on your cardiac medications before your next appointment, please call your pharmacy*  Lab Work:    FASTING LIPID AND LFT IN 2-4 WEEKS  If you have labs (blood work) drawn today and your tests are completely normal, you will receive your results only by: [place] (if you have MyChart) OR  A paper copy in the mail If you have any lab test that is abnormal or we need to change your treatment, we will call you to review the results.  You may also go to any of these [facility] locations:  [place] [address],  [place] [address] ([facility]) [address]  [place] [address] Onc[facility])  Special Instructions PLEASE READ AND FOLLOW SALTY 6-ATTACHED-1,'800mg'$  daily  PLEASE INCREASE PHYSICAL ACTIVITY AS TOLERATED   MAINTAIN STERNAL PRECAUTIONS  Follow-Up: Your next appointment:  KEEP SCHEDULED APPOINTMENT In Person with Janina Mayo, MD At [facility], you and your health needs are our priority.  As part of our continuing mission to provide you with exceptional heart care, we have created designated Provider Care Teams.  These Care Teams include your primary Cardiologist (physician) and Advanced Practice Providers (APPs -  Physician Assistants and Nurse Practitioners) who all work together to provide you with the care you need, when you need it.  Important Information About Sugar             6 SALTY THINGS TO AVOID     1,'800MG'$  DAILY

## 2021-12-13 ENCOUNTER — Other Ambulatory Visit: Payer: Self-pay | Admitting: Family Medicine

## 2021-12-13 DIAGNOSIS — I10 Essential (primary) hypertension: Secondary | ICD-10-CM

## 2021-12-30 ENCOUNTER — Other Ambulatory Visit: Payer: Self-pay | Admitting: Thoracic Surgery (Cardiothoracic Vascular Surgery)

## 2021-12-30 DIAGNOSIS — Z951 Presence of aortocoronary bypass graft: Secondary | ICD-10-CM

## 2021-12-31 ENCOUNTER — Ambulatory Visit
Admission: RE | Admit: 2021-12-31 | Discharge: 2021-12-31 | Disposition: A | Discharge status: Home / Self Care | Payer: PRIVATE HEALTH INSURANCE | Source: Ambulatory Visit | Attending: Thoracic Surgery (Cardiothoracic Vascular Surgery) | Admitting: Thoracic Surgery (Cardiothoracic Vascular Surgery)

## 2021-12-31 ENCOUNTER — Ambulatory Visit (INDEPENDENT_AMBULATORY_CARE_PROVIDER_SITE_OTHER): Payer: Self-pay | Admitting: Surgical

## 2021-12-31 VITALS — BP 132/88 | HR 79 | Resp 18 | Ht 67.0 in | Wt 175.0 lb

## 2021-12-31 DIAGNOSIS — Z951 Presence of aortocoronary bypass graft: Secondary | ICD-10-CM

## 2021-12-31 LAB — HEPATIC FUNCTION PANEL
ALT: 33 IU/L (ref 0–44)
AST: 28 IU/L (ref 0–40)
Albumin: 4.6 g/dL (ref 3.9–4.9)
Alkaline Phosphatase: 78 IU/L (ref 44–121)
Bilirubin Total: 0.4 mg/dL (ref 0.0–1.2)
Bilirubin, Direct: 0.13 mg/dL (ref 0.00–0.40)
Total Protein: 7.3 g/dL (ref 6.0–8.5)

## 2021-12-31 LAB — LIPID PANEL
Chol/HDL Ratio: 2.9 ratio (ref 0.0–5.0)
Cholesterol, Total: 184 mg/dL (ref 100–199)
HDL: 63 mg/dL (ref >39)
LDL Chol Calc (NIH): 103 mg/dL — ABNORMAL HIGH (ref 0–99)
Triglycerides: 99 mg/dL (ref 0–149)
VLDL Cholesterol Cal: 18 mg/dL (ref 5–40)

## 2021-12-31 NOTE — Progress Notes (Signed)
[address]       [place] [address]             [phone]      Mark [name]. [place] Record #[id number] Date of Birth: [birth year]  Referring: Sherren Mocha, MD Primary Care: Kathyrn Drown, MD Primary Cardiologist: Janina Mayo, MD   Chief Complaint:   POST OP FOLLOW UP 11/13/2021 Patient:  Mark [name]. Pre-Op Dx: 3V CAD HTN HLP   Post-op Dx:  same Procedure: CABG X 4.  LIMA LAD, RSVG PAV, Diagonal, OM1   Endoscopic greater saphenous vein harvest on the right     Surgeon and Role:      * Lightfoot, Lucile Crater, MD - Primary    * E. Barrett, PA-C - assisting History of Present Illness:    The patient is a 61 year old male status post the above described procedures in the office on today's date and routine postsurgical follow-up.  He reports that he is feeling quite well.  He denies chest pain or shortness of breath.  He is not having palpitations or lower extremity edema.  He denies fevers, chills or other significant constitutional symptoms.  He is walking daily up to 3 miles.  Anxious to go back to work.  Has had difficulties with his incisions.      Past Medical History:  Diagnosis Date   Cancer ([place]) 15-20 years ago   squamous cell carcinoma off face   Coronary artery disease    COVID 04/2019   tested positive but no symptoms   GERD (gastroesophageal reflux disease)    Hypertension      Social History   Tobacco Use  Smoking Status Former   Packs/day: 1.00   Years: 15.00   Total pack years: 15.00   Types: Cigarettes   Start date: 87   Quit date: 09/04/1996   Years since quitting: 25.3  Smokeless Tobacco Never    Social History   Substance and Sexual Activity  Alcohol Use Yes   Comment: Rarely     No Known Allergies  Current Outpatient Medications  Medication Sig Dispense Refill   aspirin EC 325 MG tablet Take 1 tablet (325 mg total) by mouth daily.     famotidine (PEPCID) 20 MG tablet TAKE 1  TABLET BY MOUTH TWICE A DAY 60 tablet 3   metoprolol tartrate (LOPRESSOR) 25 MG tablet Take 1 tablet (25 mg total) by mouth 2 (two) times daily. 60 tablet 11   Multiple Vitamins-Minerals (MULTIVITAMINS THER. W/MINERALS) TABS Take 1 tablet by mouth daily.     Multiple Vitamins-Minerals (PRESERVISION AREDS PO) Take 1 Capful by mouth in the morning and at bedtime.     rosuvastatin (CRESTOR) 40 MG tablet Take 1 tablet (40 mg total) by mouth daily. 90 tablet 1   albuterol (VENTOLIN HFA) 108 (90 Base) MCG/ACT inhaler Inhale 2 puffs into the lungs every 4 (four) hours as needed for wheezing or shortness of breath. (Patient not taking: Reported on 12/31/2021) 8 g 4   No current facility-administered medications for this visit.       Physical Exam: BP 132/88 (BP Location: Left Arm, Patient Position: Sitting)   Pulse 79   Resp 18   Ht '5\' 7"'$  (1.702 m)   Wt 175 lb (79.4 kg)   SpO2 96% Comment: RA  BMI 27.41 kg/m   General appearance: alert, cooperative, and no distress Heart: regular rate and rhythm, S1, S2 normal,  no murmur, click, rub or gallop Lungs: clear to auscultation bilaterally Abdomen: Benign exam Extremities: No edema Wound: Incisions healing well without evidence of infection   Diagnostic Studies & Laboratory data:     Recent Radiology Findings:   DG Chest 2 View  Result Date: 12/31/2021 CLINICAL DATA:  Follow-up after coronary bypass surgery EXAM: CHEST - 2 VIEW COMPARISON:  Previous studies including the examination of 11/15/2021 FINDINGS: Transverse diameter of heart is slightly increased. There is previous coronary bypass surgery. There are no signs of pulmonary edema or focal pulmonary consolidation. There is no pleural effusion or pneumothorax. There is improvement in the aeration of both lower lung fields. There is interval removal of right IJ central venous catheter. IMPRESSION: Previous coronary bypass surgery.  No active disease is seen. Electronically Signed   By: Elmer Picker M.D.   On: 12/31/2021 10:42      Recent Lab Findings: Lab Results  Component Value Date   WBC 9.6 11/16/2021   HGB 9.5 (L) 11/16/2021   HCT 29.0 (L) 11/16/2021   PLT 116 (L) 11/16/2021   GLUCOSE 102 (H) 11/16/2021   CHOL 184 12/30/2021   TRIG 99 12/30/2021   HDL 63 12/30/2021   LDLCALC 103 (H) 12/30/2021   ALT 33 12/30/2021   AST 28 12/30/2021   NA 140 11/16/2021   K 4.1 11/16/2021   CL 108 11/16/2021   CREATININE 1.01 11/16/2021   BUN 13 11/16/2021   CO2 26 11/16/2021   INR 1.3 (H) 11/13/2021   HGBA1C 5.8 (H) 11/11/2021      Assessment / Plan: Patient is doing very well.  He is very anxious to go back to work.  I discussed that he can return to work at 10 weeks which she agrees with.  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  I agreed with this and gave him a note.  I reviewed his chest x-ray and there are no concerning findings.  I did not make any changes to his current medication regimen.  He has seen cardiology continue to follow-up with them.  We will follow-up with the patient on a as needed basis for any surgical needs or at request.      Medication Changes: No orders of the defined types were placed in this encounter.     John Giovanni, PA-C  12/31/2021 1:48 PM

## 2021-12-31 NOTE — Patient Instructions (Signed)
Discussed the importance of ongoing activity and lifestyle management for coronary artery disease.  This includes medication management of any chronic illness.

## 2022-01-01 ENCOUNTER — Other Ambulatory Visit: Payer: Self-pay

## 2022-01-01 DIAGNOSIS — E782 Mixed hyperlipidemia: Secondary | ICD-10-CM

## 2022-02-04 ENCOUNTER — Telehealth: Payer: Self-pay | Admitting: Nurse Practitioner

## 2022-02-04 NOTE — Telephone Encounter (Signed)
Patient reports new onset cough with episodic chest pain x 1- 2 days. Reports chest pain with deep cough and has also had small amount of mucus production. Denies fever or upper respiratory symptoms. Otherwise, reports feeling fine. I did recommend patient follow-up with PCP or cardiology given limitations of occupational health. Patient has follow-up with cardiology tomorrow afternoon. Meanwhile recommended increase water intake and use of mucinex to help thin mucus. Discussed seeking immediate care if chest pain becomes more frequent or worsens.Patient is agreeable.

## 2022-02-05 ENCOUNTER — Ambulatory Visit: Payer: PRIVATE HEALTH INSURANCE | Attending: Internal Medicine | Admitting: Internal Medicine

## 2022-02-05 ENCOUNTER — Encounter: Payer: Self-pay | Admitting: Internal Medicine

## 2022-02-05 VITALS — BP 120/78 | HR 85 | Ht 67.5 in | Wt 178.4 lb

## 2022-02-05 DIAGNOSIS — E782 Mixed hyperlipidemia: Secondary | ICD-10-CM | POA: Diagnosis not present

## 2022-02-05 MED ORDER — NITROGLYCERIN 0.4 MG SL SUBL: 0.4000 mg | SUBLINGUAL_TABLET | SUBLINGUAL | 3 refills | Status: DC | PRN

## 2022-02-05 MED ORDER — EZETIMIBE 10 MG PO TABS: 10.0000 mg | ORAL_TABLET | Freq: Every day | ORAL | 3 refills | Status: DC

## 2022-02-05 MED ORDER — METOPROLOL SUCCINATE ER 50 MG PO TB24: 50.0000 mg | ORAL_TABLET | Freq: Every day | ORAL | 3 refills | Status: DC

## 2022-02-05 MED ORDER — ASPIRIN 81 MG PO TBEC: 81.0000 mg | DELAYED_RELEASE_TABLET | Freq: Every day | ORAL | 3 refills | Status: DC

## 2022-02-05 NOTE — Patient Instructions (Addendum)
Medication Instructions:  Your physician has recommended you make the following change in your medication:  STOP: Metoprolol tartrate (Lopressor) START: Metoprolol succinate (Toprol-XL) 50 mg once daily START: Zetia 10 mg once daily  START: Nitroglycerin 0.4 mg place 1 tablet under your tongue every 5 minutes (up to three times) as needed for chest pain. DECREASE: Aspirin 81 mg once daily  *If you need a refill on your cardiac medications before your next appointment, please call your pharmacy*   Lab Work: In 3 months:  Lipids - please come fasting If you have labs (blood work) drawn today and your tests are completely normal, you will receive your results only by: [place] (if you have MyChart) OR A paper copy in the mail If you have any lab test that is abnormal or we need to change your treatment, we will call you to review the results.   Testing/Procedures: None   Follow-Up: At [facility], you and your health needs are our priority.  As part of our continuing mission to provide you with exceptional heart care, we have created designated Provider Care Teams.  These Care Teams include your primary Cardiologist (physician) and Advanced Practice Providers (APPs -  Physician Assistants and Nurse Practitioners) who all work together to provide you with the care you need, when you need it.  We recommend signing up for the patient portal called "MyChart".  Sign up information is provided on this After Visit Summary.  MyChart is used to connect with patients for Virtual Visits (Telemedicine).  Patients are able to view lab/test results, encounter notes, upcoming appointments, etc.  Non-urgent messages can be sent to your provider as well.   To learn more about what you can do with MyChart, go to [email or web address].    Your next appointment:   1 year(s)  The format for your next appointment:   In Person  Provider:   Janina Mayo, MD     Other  Instructions   Important Information About Sugar

## 2022-02-05 NOTE — Progress Notes (Signed)
Cardiology Office Note:    Date:  02/05/2022   ID:  Mark Dell., DOB 1960/11/27, MRN [id number]  PCP:  Mark Drown, MD   [facility] Providers Cardiologist:  Mark Mayo, MD     Referring MD: Mark Drown, MD   No chief complaint on file. Chest pain    History of Present Illness:   09/20/2021 Mark [name]. is a 61 y.o. male with a hx of squamous cell carcinoma of the skin referral for shortness of breath, chest discomfort   He reports chest tightness and worsening dyspnea most intense on Saturday. He was doing lawn work, moving tree branches around. He is feeling more shortness of breath with walking. This has been going on for approximately 3 weeks duration. At rest he has not chest discomfort. He has no symptoms with ordinary activities.  He reports a stress test ~ 20 years ago. His EKG shows mild LVH with repol. Cxray showed chronic interstitial lung dx. Normal renal function. Blood pressures controlled at home with occasional screening. His father had MI in his 51s.   Interim Hx 10/27/2021 Mark [name] underwent [name] 09/27/2021. His EKG showed inferolateral TWI worsened with stress. His METS were 9.7 which is a good exercise response. However his MPHR was 77% (optimal is 85%). No inducible ischemia or scar was identified. He continued to have symptoms and Mark [name] was concerned. With suboptimal MPHR he suggested further risk stratification. I obtained a coronary CTA and he had FFR + proximal LAD and OM1 lesion. He is now planned for [name] tomorrow 10/30/2021. He continues to have SOB with activity and chest tightness. No symptoms at rest. No bleeding issues. He is prepared for the procedure  Interim hx 02/05/2022 He underwent LHC showing MVD. 70-75% prox LAD, critical 95% D1, OM1 90%, moderate Lcx dx,. He was recommended for CABG x4V LIMA-LAD, SVG to OM, SVG to Diag, SVG PAV. Valsartan was stopped during hospitalization. BP normal today. LDL 103 mg/dL 12/30/2021.  Normal LFTs.  Past Medical History:  Diagnosis Date   Cancer ([place]) 15-20 years ago   squamous cell carcinoma off face   Coronary artery disease    COVID 04/2019   tested positive but no symptoms   GERD (gastroesophageal reflux disease)    Hypertension     Past Surgical History:  Procedure Laterality Date   BACK SURGERY     CATARACT EXTRACTION Left    COLONOSCOPY  03/19/2011   Procedure: COLONOSCOPY;  Surgeon: Rogene Houston, MD;  [facility];  Service: Endoscopy;  Laterality: N/A;   CORONARY ARTERY BYPASS GRAFT N/A 11/13/2021   Procedure: CORONARY ARTERY BYPASS GRAFTING (CABG) X FOUR BYPASSES USING OPEN LEFT INTERNAL MAMMARY ARTERY AND ENDOSCOPIC RIGHT GREATER SAPHENOUS VEIN HARVEST.;  Surgeon: Lajuana Matte, MD;  Location: [place];  Service: Open Heart Surgery;  Laterality: N/A;   EYE SURGERY Left    for hole in eye   LEFT HEART CATH AND CORONARY ANGIOGRAPHY N/A 10/30/2021   Procedure: LEFT HEART CATH AND CORONARY ANGIOGRAPHY;  Surgeon: Sherren Mocha, MD;  Location: [place] CV LAB;  Service: Cardiovascular;  Laterality: N/A;   SHOULDER SURGERY Left    skin cancer removed from face     TEE WITHOUT CARDIOVERSION N/A 11/13/2021   Procedure: TRANSESOPHAGEAL ECHOCARDIOGRAM (TEE);  Surgeon: Lajuana Matte, MD;  Location: [place];  Service: Open Heart Surgery;  Laterality: N/A;   UMBILICAL HERNIA REPAIR N/A 09/15/2014   Procedure: HERNIA REPAIR  UMBILICAL ADULT;  Surgeon: III Dia Crawford, MD;  Location: [facility] ORS;  Service: General;  Laterality: N/A;   VASECTOMY      Current Medications: Current Outpatient Medications on File Prior to Visit  Medication Sig Dispense Refill   albuterol (VENTOLIN HFA) 108 (90 Base) MCG/ACT inhaler Inhale 2 puffs into the lungs every 4 (four) hours as needed for wheezing or shortness of breath. 8 g 4   famotidine (PEPCID) 20 MG tablet TAKE 1 TABLET BY MOUTH TWICE A DAY 60 tablet 3   Multiple Vitamins-Minerals (MULTIVITAMINS THER.  W/MINERALS) TABS Take 1 tablet by mouth daily.     Multiple Vitamins-Minerals (PRESERVISION AREDS PO) Take 1 Capful by mouth in the morning and at bedtime.     rosuvastatin (CRESTOR) 40 MG tablet Take 1 tablet (40 mg total) by mouth daily. 90 tablet 1   No current facility-administered medications on file prior to visit.    Allergies:   Patient has no known allergies.   Social History   Socioeconomic History   Marital status: Married    Spouse name: Not on file   Number of children: Not on file   Years of education: Not on file   Highest education level: Not on file  Occupational History   Not on file  Tobacco Use   Smoking status: Former    Packs/day: 1.00    Years: 15.00    Total pack years: 15.00    Types: Cigarettes    Start date: 90    Quit date: 09/04/1996    Years since quitting: 25.4   Smokeless tobacco: Never  Vaping Use   Vaping Use: Not on file  Substance and Sexual Activity   Alcohol use: Yes    Comment: Rarely   Drug use: No   Sexual activity: Yes  Other Topics Concern   Not on file  Social History Narrative   ** Merged History Encounter **       Social Determinants of Health   Financial Resource Strain: Not on file  Food Insecurity: Not on file  Transportation Needs: Not on file  Physical Activity: Not on file  Stress: Not on file  Social Connections: Not on file     Family History: The patient's family history includes Heart disease in his father; Pulmonary fibrosis in his father. There is no history of Colon cancer.  ROS:   Please see the history of present illness.     All other systems reviewed and are negative.  EKGs/Labs/Other Studies Reviewed:    The following studies were reviewed today:   EKG:  EKG is  ordered today.  The ekg ordered today demonstrates   09/20/2021-NSR, LVH  Recent Labs: 10/02/2021: BNP 18.2 11/14/2021: Magnesium 2.4 11/16/2021: BUN 13; Creatinine, Ser 1.01; Hemoglobin 9.5; Platelets 116; Potassium 4.1; Sodium  140 12/30/2021: ALT 33   Recent Lipid Panel    Component Value Date/Time   CHOL 184 12/30/2021 0840   TRIG 99 12/30/2021 0840   HDL 63 12/30/2021 0840   CHOLHDL 2.9 12/30/2021 0840   CHOLHDL 3.0 07/02/2021 0830   VLDL 13 07/14/2013 0704   LDLCALC 103 (H) 12/30/2021 0840   LDLCALC 93 07/02/2021 0830     Risk Assessment/Calculations:           Physical Exam:    VS:   Vitals:   02/05/22 1445  BP: 120/78  Pulse: 85  SpO2: 95%      Wt Readings from Last 3 Encounters:  02/05/22 178 lb 6.4  oz (80.9 kg)  12/31/21 175 lb (79.4 kg)  12/06/21 175 lb (79.4 kg)     Physical Exam Gen: well appearing Neuro: alert and oriented CV: r,r,r no murmurs. 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 With CVD risk (age, family hx, htn), will proceed with exercise nuclear stress was sub-optimal per above. Coronary CTA showed + FFR prox LAD and OM1 lesion.  Continue asa and crestor 40 mg daily. Goal LDL at least < 70 mg/dL. LHC showed MVD dx per above. S/p CABGx4.  A1c 5.8 - not at LDL goal, adding zetia 10 mg daily - ASA 325 mg -> Asa 81 mg daily - change metop tartrate 25 mg BID  to X - nitro SL PRN  HTN: well controlled.  Continue metop  PLAN:    In order of problems listed above:  Change tartrate 25 BID to XL 50 Add zetia Nitro PRN Fasting lipids 3 months FU 1 year     Medication Adjustments/Labs and Tests Ordered: Current medicines are reviewed at length with the patient today.  Concerns regarding medicines are outlined above.  Orders Placed This Encounter  Procedures   Lipid panel   Meds ordered this encounter  Medications   ezetimibe (ZETIA) 10 MG tablet    Sig: Take 1 tablet (10 mg total) by mouth daily.    Dispense:  90 tablet    Refill:  3   nitroGLYCERIN (NITROSTAT) 0.4 MG SL tablet    Sig: Place 1 tablet (0.4 mg total) under  the tongue every 5 (five) minutes as needed for chest pain.    Dispense:  90 tablet    Refill:  3   metoprolol succinate (TOPROL XL) 50 MG 24 hr tablet    Sig: Take 1 tablet (50 mg total) by mouth daily. Take with or immediately following a meal.    Dispense:  90 tablet    Refill:  3   aspirin EC 81 MG tablet    Sig: Take 1 tablet (81 mg total) by mouth daily. Swallow whole.    Dispense:  90 tablet    Refill:  3    Patient Instructions  Medication Instructions:  Your physician has recommended you make the following change in your medication:  STOP: Metoprolol tartrate (Lopressor) START: Metoprolol succinate (Toprol-XL) 50 mg once daily START: Zetia 10 mg once daily  START: Nitroglycerin 0.4 mg place 1 tablet under your tongue every 5 minutes (up to three times) as needed for chest pain. DECREASE: Aspirin 81 mg once daily  *If you need a refill on your cardiac medications before your next appointment, please call your pharmacy*   Lab Work: In 3 months:  Lipids - please come fasting If you have labs (blood work) drawn today and your tests are completely normal, you will receive your results only by: [place] (if you have MyChart) OR A paper copy in the mail If you have any lab test that is abnormal or we need to change your treatment, we will call you to review the results.   Testing/Procedures: None   Follow-Up: At [name], you and your health needs are our priority.  As part of our continuing mission to provide you with exceptional heart care, we have created designated Provider Care Teams.  These Care Teams include your primary Cardiologist (physician) and Advanced Practice Providers (  APPs -  Physician Assistants and Nurse Practitioners) who all work together to provide you with the care you need, when you need it.  We recommend signing up for the patient portal called "MyChart".  Sign up information is provided on this After Visit Summary.  MyChart is  used to connect with patients for Virtual Visits (Telemedicine).  Patients are able to view lab/test results, encounter notes, upcoming appointments, etc.  Non-urgent messages can be sent to your provider as well.   To learn more about what you can do with MyChart, go to [email or web address].    Your next appointment:   1 year(s)  The format for your next appointment:   In Person  Provider:   Janina Mayo, MD     Other Instructions   Important Information About Sugar         Signed, Mark Mayo, MD  02/05/2022 3:00 PM    [place]

## 2022-03-31 ENCOUNTER — Other Ambulatory Visit: Payer: Self-pay | Admitting: Family Medicine

## 2022-04-05 ENCOUNTER — Other Ambulatory Visit: Payer: Self-pay | Admitting: Family Medicine

## 2022-04-05 DIAGNOSIS — K219 Gastro-esophageal reflux disease without esophagitis: Secondary | ICD-10-CM

## 2022-05-03 ENCOUNTER — Other Ambulatory Visit: Payer: Self-pay | Admitting: Family Medicine

## 2022-05-03 DIAGNOSIS — K219 Gastro-esophageal reflux disease without esophagitis: Secondary | ICD-10-CM

## 2022-05-08 LAB — LIPID PANEL
Chol/HDL Ratio: 2.2 ratio (ref 0.0–5.0)
Cholesterol, Total: 144 mg/dL (ref 100–199)
HDL: 66 mg/dL (ref >39)
LDL Chol Calc (NIH): 62 mg/dL (ref 0–99)
Triglycerides: 84 mg/dL (ref 0–149)
VLDL Cholesterol Cal: 16 mg/dL (ref 5–40)

## 2022-05-20 ENCOUNTER — Ambulatory Visit: Payer: PRIVATE HEALTH INSURANCE | Admitting: Nurse Practitioner

## 2022-05-20 ENCOUNTER — Encounter: Payer: Self-pay | Admitting: Nurse Practitioner

## 2022-05-20 VITALS — BP 110/82 | HR 81 | Temp 98.0°F

## 2022-05-20 DIAGNOSIS — J0100 Acute maxillary sinusitis, unspecified: Secondary | ICD-10-CM

## 2022-05-20 MED ORDER — AMOXICILLIN-POT CLAVULANATE 875-125 MG PO TABS: 1.0000 | ORAL_TABLET | Freq: Two times a day (BID) | ORAL | 0 refills | Status: DC

## 2022-05-20 MED ORDER — FLUTICASONE PROPIONATE 50 MCG/ACT NA SUSP: 2.0000 | Freq: Two times a day (BID) | NASAL | 0 refills | Status: AC | PRN

## 2022-05-20 NOTE — Progress Notes (Signed)
Acute Office Visit  Subjective:     Patient ID: Mark [name]., male    DOB: [birth year], 62 y.o.   MRN: [id number]  Chief Complaint  Patient presents with   Sinus Problem    Patient presents today for "sinus infection" which he noticed started at least a month ago. Despite supportive care measures, symptoms persist.  Reports continued nasal drainage, congestion, cough with green sputum production and facial pressure. He has trouble breathing through his nose.   Denies sore throat, fevers, chills, myalgias, and chest pain.  Has been using night time saline spray and a humidifier which has helped relieve some symptoms.   Review of Systems  Constitutional:  Negative for chills and fever.  HENT:  Positive for congestion and sinus pain. Negative for ear discharge, ear pain and sore throat.   Respiratory:  Positive for cough and sputum production. Negative for shortness of breath and wheezing.   Cardiovascular:  Negative for chest pain.  Gastrointestinal:  Negative for constipation, diarrhea, nausea and vomiting.  Musculoskeletal:  Negative for myalgias.  Neurological:  Negative for dizziness, weakness and headaches.        Objective:    BP 110/82   Pulse 81   Temp 98 F (36.7 C)   SpO2 96%    Physical Exam Constitutional:      General: He is not in acute distress. HENT:     Head: Normocephalic.     Right Ear: Tympanic membrane, ear canal and external ear normal.     Left Ear: Tympanic membrane, ear canal and external ear normal.     Nose: Congestion present.     Right Sinus: Maxillary sinus tenderness present. No frontal sinus tenderness.     Left Sinus: Maxillary sinus tenderness present. No frontal sinus tenderness.     Mouth/Throat:     Pharynx: Posterior oropharyngeal erythema (slight) present.     Tonsils: No tonsillar exudate.  Eyes:     Pupils: Pupils are equal, round, and reactive to light.  Cardiovascular:     Rate and Rhythm: Normal rate and regular  rhythm.     Heart sounds: Normal heart sounds.  Pulmonary:     Effort: Pulmonary effort is normal.     Breath sounds: Normal breath sounds.  Musculoskeletal:        General: Normal range of motion.  Neurological:     General: No focal deficit present.     Mental Status: He is alert and oriented to person, place, and time.     No results found for any visits on 05/20/22.      Assessment & Plan:   Problem List Items Addressed This Visit   None Visit Diagnoses     Acute non-recurrent sinusitis    -  Primary   Relevant Medications   amoxicillin-clavulanate (AUGMENTIN) 875-125 MG tablet   fluticasone (FLONASE) 50 MCG/ACT nasal spray     Due to length of symptoms will treat with antibiotics. Continue supportive care measures including use of flonase. Patient declines cough medication at this time, as cough has not been persistent.  Instructed to follow-up if symptoms worsen or fail to improve.   Meds ordered this encounter  Medications   amoxicillin-clavulanate (AUGMENTIN) 875-125 MG tablet    Sig: Take 1 tablet by mouth 2 (two) times daily.    Dispense:  20 tablet    Refill:  0    Order Specific Question:   Supervising Provider    Answer:   Odis Luster [  [id number]]   fluticasone (FLONASE) 50 MCG/ACT nasal spray    Sig: Place 2 sprays into both nostrils 2 (two) times daily as needed for allergies or rhinitis.    Dispense:  16 g    Refill:  0    Order Specific Question:   Supervising Provider    Answer:   Odis Luster [place]    Lurena Joiner, NP

## 2022-05-29 ENCOUNTER — Other Ambulatory Visit: Payer: Self-pay | Admitting: Family Medicine

## 2022-06-16 ENCOUNTER — Ambulatory Visit (HOSPITAL_BASED_OUTPATIENT_CLINIC_OR_DEPARTMENT_OTHER): Payer: PRIVATE HEALTH INSURANCE | Admitting: Internal Medicine

## 2022-06-28 ENCOUNTER — Other Ambulatory Visit: Payer: Self-pay | Admitting: Family Medicine

## 2022-07-19 ENCOUNTER — Other Ambulatory Visit: Payer: Self-pay | Admitting: Family Medicine

## 2022-07-19 DIAGNOSIS — K219 Gastro-esophageal reflux disease without esophagitis: Secondary | ICD-10-CM

## 2022-09-22 ENCOUNTER — Other Ambulatory Visit: Payer: Self-pay | Admitting: Family Medicine

## 2022-10-20 ENCOUNTER — Other Ambulatory Visit: Payer: Self-pay | Admitting: Family Medicine

## 2022-10-31 ENCOUNTER — Other Ambulatory Visit: Payer: Self-pay | Admitting: Family Medicine

## 2022-10-31 DIAGNOSIS — K219 Gastro-esophageal reflux disease without esophagitis: Secondary | ICD-10-CM

## 2022-11-18 ENCOUNTER — Ambulatory Visit: Payer: PRIVATE HEALTH INSURANCE | Admitting: Nurse Practitioner

## 2022-11-18 ENCOUNTER — Encounter: Payer: Self-pay | Admitting: Nurse Practitioner

## 2022-11-18 VITALS — BP 122/82 | HR 76 | Ht 68.0 in | Wt 175.0 lb

## 2022-11-18 DIAGNOSIS — Z789 Other specified health status: Secondary | ICD-10-CM

## 2022-11-18 NOTE — Progress Notes (Signed)
[facility]  Subjective:  Patient ID: Mark Golson., male    DOB: Aug 14, 1960  Age: 62 y.o. MRN: [id number]  CC: Wellness exam  HPI Mark [name]. presents for wellness exam visit for insurance benefit.  Patient has a PCP: Dr. Gerda Diss PMH significant for: S/P CABG x4, HLD Last labs per PCP were completed: Aug 2023 and lipid panel in Dec 2023  Health Maintenance:  Colonoscopy: 2012, due for this.     Smoker: former  Immunizations:  Shingrix-  completed  COVID- completed x 4 Tdap; 2021  Lifestyle: Diet- eats well balanced meal, protein and vegetables. Exercise- walks and uses rowing machine.      Past Medical History:  Diagnosis Date   Cancer ([facility]) 15-20 years ago   squamous cell carcinoma off face   Coronary artery disease    COVID 04/2019   tested positive but no symptoms   GERD (gastroesophageal reflux disease)    Hypertension     Past Surgical History:  Procedure Laterality Date   BACK SURGERY     CATARACT EXTRACTION Left    COLONOSCOPY  03/19/2011   Procedure: COLONOSCOPY;  Surgeon: Malissa Hippo, MD;  [facility];  Service: Endoscopy;  Laterality: N/A;   CORONARY ARTERY BYPASS GRAFT N/A 11/13/2021   Procedure: CORONARY ARTERY BYPASS GRAFTING (CABG) X FOUR BYPASSES USING OPEN LEFT INTERNAL MAMMARY ARTERY AND ENDOSCOPIC RIGHT GREATER SAPHENOUS VEIN HARVEST.;  Surgeon: Corliss Skains, MD;  Location: [facility];  Service: Open Heart Surgery;  Laterality: N/A;   EYE SURGERY Left    for hole in eye   LEFT HEART CATH AND CORONARY ANGIOGRAPHY N/A 10/30/2021   Procedure: LEFT HEART CATH AND CORONARY ANGIOGRAPHY;  Surgeon: Tonny Bollman, MD;  Location: [facility];  Service: Cardiovascular;  Laterality: N/A;   SHOULDER SURGERY Left    skin cancer removed from face     TEE WITHOUT CARDIOVERSION N/A 11/13/2021   Procedure: TRANSESOPHAGEAL ECHOCARDIOGRAM (TEE);  Surgeon: Corliss Skains, MD;  Location: [facility] OR;  Service:  Open Heart Surgery;  Laterality: N/A;   UMBILICAL HERNIA REPAIR N/A 09/15/2014   Procedure: HERNIA REPAIR UMBILICAL ADULT;  Surgeon: III Tiney Rouge, MD;  Location: [facility] ORS;  Service: General;  Laterality: N/A;   VASECTOMY      Outpatient Medications Prior to Visit  Medication Sig Dispense Refill   albuterol (VENTOLIN HFA) 108 (90 Base) MCG/ACT inhaler Inhale 2 puffs into the lungs every 4 (four) hours as needed for wheezing or shortness of breath. 8 g 4   aspirin EC 81 MG tablet Take 1 tablet (81 mg total) by mouth daily. Swallow whole. 90 tablet 3   ezetimibe (ZETIA) 10 MG tablet Take 1 tablet (10 mg total) by mouth daily. 90 tablet 3   famotidine (PEPCID) 20 MG tablet TAKE 1 TABLET BY MOUTH TWICE A DAY 60 tablet 2   fluticasone (FLONASE) 50 MCG/ACT nasal spray Place 2 sprays into both nostrils 2 (two) times daily as needed for allergies or rhinitis. 16 g 0   metoprolol succinate (TOPROL XL) 50 MG 24 hr tablet Take 1 tablet (50 mg total) by mouth daily. Take with or immediately following a meal. 90 tablet 3   Multiple Vitamins-Minerals (MULTIVITAMINS THER. W/MINERALS) TABS Take 1 tablet by mouth daily.     Multiple Vitamins-Minerals (PRESERVISION AREDS PO) Take 1 Capful by mouth in the morning and at bedtime.     rosuvastatin (CRESTOR) 40 MG tablet TAKE 1  TABLET BY MOUTH EVERY DAY 90 tablet 0   nitroGLYCERIN (NITROSTAT) 0.4 MG SL tablet Place 1 tablet (0.4 mg total) under the tongue every 5 (five) minutes as needed for chest pain. 90 tablet 3   amoxicillin-clavulanate (AUGMENTIN) 875-125 MG tablet Take 1 tablet by mouth 2 (two) times daily. 20 tablet 0   No facility-administered medications prior to visit.    ROS Review of Systems  Constitutional:  Negative for fatigue.  HENT:  Negative for hearing loss.   Respiratory:  Negative for shortness of breath.   Cardiovascular:  Negative for chest pain, palpitations and leg swelling.  Gastrointestinal:  Negative for constipation, diarrhea,  nausea and vomiting.  Endocrine: Negative for polydipsia, polyphagia and polyuria.  Musculoskeletal:  Positive for arthralgias (chronic shoulder pain).  Neurological:  Negative for dizziness and headaches.    Objective:  BP 122/82   Pulse 76   Ht 5\' 8"  (1.727 m)   Wt 175 lb (79.4 kg)   SpO2 100%   BMI 26.61 kg/m   Physical Exam Constitutional:      General: He is not in acute distress. HENT:     Head: Normocephalic.     Right Ear: Tympanic membrane, ear canal and external ear normal.     Left Ear: Tympanic membrane, ear canal and external ear normal.     Mouth/Throat:     Pharynx: No oropharyngeal exudate or posterior oropharyngeal erythema.  Eyes:     Pupils: Pupils are equal, round, and reactive to light.  Cardiovascular:     Rate and Rhythm: Normal rate and regular rhythm.     Pulses: Normal pulses.     Heart sounds: Normal heart sounds.  Pulmonary:     Effort: Pulmonary effort is normal.     Breath sounds: Normal breath sounds.  Abdominal:     General: Bowel sounds are normal.     Palpations: Abdomen is soft.  Musculoskeletal:        General: Normal range of motion.  Skin:    General: Skin is warm.  Neurological:     General: No focal deficit present.     Mental Status: He is alert and oriented to person, place, and time.  Psychiatric:        Mood and Affect: Mood normal.        Behavior: Behavior normal.      Assessment & Plan:    Inocente was seen today for wellness exam.  Diagnoses and all orders for this visit:  Participant in health and wellness plan   Adult wellness physical was conducted today. Importance of diet and exercise were discussed in detail.  We reviewed immunizations and gave recommendations regarding current immunization needed for age.  Preventative health exams needed: Colonoscopy is due. Recommended discussing referral with PCP.   Patient was advised yearly wellness exam and follow-up with PCP as scheduled.   No orders of the  defined types were placed in this encounter.   No orders of the defined types were placed in this encounter.   Follow-up: Return if symptoms worsen or fail to improve.

## 2023-01-09 ENCOUNTER — Other Ambulatory Visit: Payer: Self-pay | Admitting: Family Medicine

## 2023-01-15 ENCOUNTER — Telehealth: Payer: Self-pay

## 2023-01-15 NOTE — Telephone Encounter (Signed)
Pt has to have Med Check appt with Dr Lorin Picket Monday and needs blood work ordered can someone order his blood work and reach out to him to let him know.   Pt [phone]

## 2023-01-15 NOTE — Telephone Encounter (Signed)
Patient due for lipid, liver, metabolic 7, CBC, PSA  Diagnosis hypocalcemia, microcytic anemia, prostate cancer screening, hyperlipidemia, high risk medication

## 2023-01-16 ENCOUNTER — Other Ambulatory Visit: Payer: Self-pay

## 2023-01-16 DIAGNOSIS — Z79899 Other long term (current) drug therapy: Secondary | ICD-10-CM

## 2023-01-16 DIAGNOSIS — I10 Essential (primary) hypertension: Secondary | ICD-10-CM

## 2023-01-16 DIAGNOSIS — E782 Mixed hyperlipidemia: Secondary | ICD-10-CM

## 2023-01-16 DIAGNOSIS — E162 Hypoglycemia, unspecified: Secondary | ICD-10-CM

## 2023-01-16 DIAGNOSIS — Z125 Encounter for screening for malignant neoplasm of prostate: Secondary | ICD-10-CM

## 2023-01-16 DIAGNOSIS — D649 Anemia, unspecified: Secondary | ICD-10-CM

## 2023-01-16 NOTE — Telephone Encounter (Signed)
Called pt to inform of labs being ordered, no answer left message

## 2023-01-16 NOTE — Telephone Encounter (Signed)
Patient has appointment  01/19/23  for medication followup

## 2023-01-19 ENCOUNTER — Ambulatory Visit (INDEPENDENT_AMBULATORY_CARE_PROVIDER_SITE_OTHER): Payer: PRIVATE HEALTH INSURANCE | Admitting: Family Medicine

## 2023-01-19 VITALS — BP 116/78 | HR 78 | Temp 98.8°F | Ht 68.0 in | Wt 174.2 lb

## 2023-01-19 DIAGNOSIS — E785 Hyperlipidemia, unspecified: Secondary | ICD-10-CM | POA: Diagnosis not present

## 2023-01-19 DIAGNOSIS — K219 Gastro-esophageal reflux disease without esophagitis: Secondary | ICD-10-CM

## 2023-01-19 DIAGNOSIS — Z1211 Encounter for screening for malignant neoplasm of colon: Secondary | ICD-10-CM

## 2023-01-19 MED ORDER — FAMOTIDINE 20 MG PO TABS: 20.0000 mg | ORAL_TABLET | Freq: Two times a day (BID) | ORAL | 3 refills | Status: DC

## 2023-01-19 MED ORDER — ROSUVASTATIN CALCIUM 40 MG PO TABS: 40.0000 mg | ORAL_TABLET | Freq: Every day | ORAL | 3 refills | Status: DC

## 2023-01-19 NOTE — Progress Notes (Signed)
   Subjective:    Patient ID: Mark [name]., male    DOB: 1960-10-18, 62 y.o.   MRN: [id number]  HPI Pt comes in today for Med check follow up. Patient has underlying history of heart bypass Overall doing well on current medicines Trying to eat healthy and stay physically active Works 50 hours a week Denies chest tightness shortness of breath Lab work ordered up awaiting results Patient compliant with cholesterol medicine Compliant with aspirin  Review of Systems     Objective:   Physical Exam General-in no acute distress Eyes-no discharge Lungs-respiratory rate normal, CTA CV-no murmurs,RRR Extremities skin warm dry no edema Neuro grossly normal Behavior normal, alert        Assessment & Plan:   1. Hyperlipidemia, unspecified hyperlipidemia type Hyperlipidemia-importance of diet, weight control, activity, compliance with medications discussed.   Recent labs reviewed.   Any additional labs or refills ordered.   Importance of keeping under good control discussed. Regular follow-up visits discussed Goal is to get LDL below 70 Awaiting lab work   2. Gastroesophageal reflux disease without esophagitis Doing well with medicine he can back down to 1/day as long as symptoms stay under control - famotidine (PEPCID) 20 MG tablet; Take 1 tablet (20 mg total) by mouth 2 (two) times daily.  Dispense: 180 tablet; Refill: 3  3. Screening for colon cancer Screening Patient denies any cardiac symptoms able to do physical activity without trouble

## 2023-01-20 ENCOUNTER — Encounter (INDEPENDENT_AMBULATORY_CARE_PROVIDER_SITE_OTHER): Payer: Self-pay | Admitting: *Deleted

## 2023-01-20 LAB — CBC WITH DIFFERENTIAL/PLATELET
Basophils Absolute: 0.1 10*3/uL (ref 0.0–0.2)
Basos: 1 %
EOS (ABSOLUTE): 0.1 10*3/uL (ref 0.0–0.4)
Eos: 2 %
Hematocrit: 45.8 % (ref 37.5–51.0)
Hemoglobin: 14.9 g/dL (ref 13.0–17.7)
Immature Grans (Abs): 0 10*3/uL (ref 0.0–0.1)
Immature Granulocytes: 0 %
Lymphocytes Absolute: 1.3 10*3/uL (ref 0.7–3.1)
Lymphs: 27 %
MCH: 32 pg (ref 26.6–33.0)
MCHC: 32.5 g/dL (ref 31.5–35.7)
MCV: 99 fL — ABNORMAL HIGH (ref 79–97)
Monocytes Absolute: 0.6 10*3/uL (ref 0.1–0.9)
Monocytes: 13 %
Neutrophils Absolute: 2.8 10*3/uL (ref 1.4–7.0)
Neutrophils: 57 %
Platelets: 219 10*3/uL (ref 150–450)
RBC: 4.65 x10E6/uL (ref 4.14–5.80)
RDW: 12.4 % (ref 11.6–15.4)
WBC: 4.9 10*3/uL (ref 3.4–10.8)

## 2023-01-20 LAB — BASIC METABOLIC PANEL
BUN/Creatinine Ratio: 17 (ref 10–24)
BUN: 17 mg/dL (ref 8–27)
CO2: 21 mmol/L (ref 20–29)
Calcium: 9.7 mg/dL (ref 8.6–10.2)
Chloride: 101 mmol/L (ref 96–106)
Creatinine, Ser: 1.03 mg/dL (ref 0.76–1.27)
Glucose: 106 mg/dL — ABNORMAL HIGH (ref 70–99)
Potassium: 4.3 mmol/L (ref 3.5–5.2)
Sodium: 141 mmol/L (ref 134–144)
eGFR: 82 mL/min/{1.73_m2} (ref >59)

## 2023-01-20 LAB — HEPATIC FUNCTION PANEL
ALT: 70 IU/L — ABNORMAL HIGH (ref 0–44)
AST: 60 IU/L — ABNORMAL HIGH (ref 0–40)
Albumin: 4.7 g/dL (ref 3.9–4.9)
Alkaline Phosphatase: 65 IU/L (ref 44–121)
Bilirubin Total: 0.3 mg/dL (ref 0.0–1.2)
Bilirubin, Direct: 0.14 mg/dL (ref 0.00–0.40)
Total Protein: 7.4 g/dL (ref 6.0–8.5)

## 2023-01-20 LAB — LIPID PANEL
Chol/HDL Ratio: 2.1 ratio (ref 0.0–5.0)
Cholesterol, Total: 163 mg/dL (ref 100–199)
HDL: 76 mg/dL (ref >39)
LDL Chol Calc (NIH): 74 mg/dL (ref 0–99)
Triglycerides: 66 mg/dL (ref 0–149)
VLDL Cholesterol Cal: 13 mg/dL (ref 5–40)

## 2023-01-20 LAB — PSA: Prostate Specific Ag, Serum: 0.8 ng/mL (ref 0.0–4.0)

## 2023-01-27 ENCOUNTER — Other Ambulatory Visit: Payer: Self-pay

## 2023-01-27 DIAGNOSIS — R748 Abnormal levels of other serum enzymes: Secondary | ICD-10-CM

## 2023-01-30 IMAGING — CT CT HEART MORP W/ CTA COR W/ SCORE W/ CA W/CM &/OR W/O CM
1 of 14 series · 3 of 20 positions shown, 4 images · non-contrast
Comparison: None available.

Addendum:
CLINICAL DATA: Chest pain and shortness of breath

EXAM:
Cardiac/Coronary  CTA
TECHNIQUE: The patient was scanned on a Siemens Somatom go.Top scanner.
CLINICAL DATA: This over-read does not include interpretation of
cardiac or coronary anatomy or pathology. The coronary CTA
interpretation by the cardiologist is attached.

[Series 20: multiphase % cta coronary 0.60 · axial · 0.36mm/px · z∈[-1090,-1034]mm · 3 of 3124 slices shown, 4 images]
[im 781/3124  vessel]
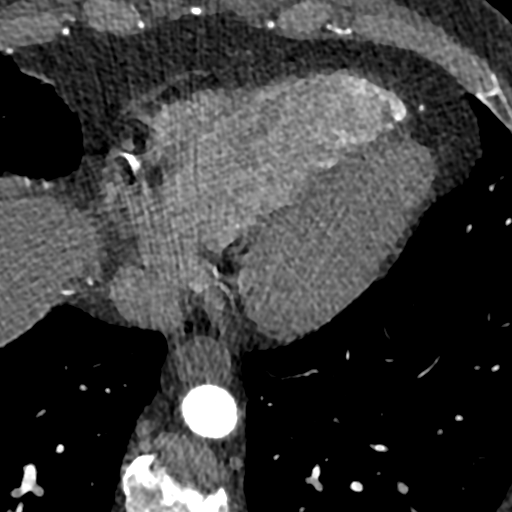
[im 781/3124  lung]
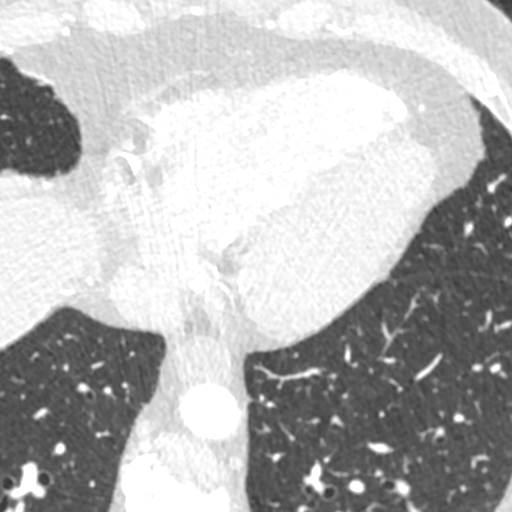
[im 1562/3124  vessel]
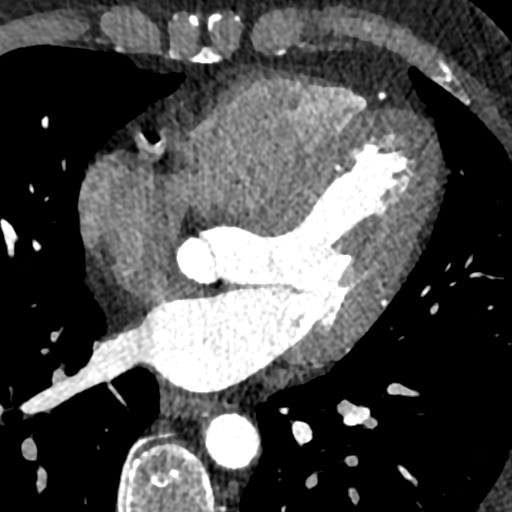
[im 2343/3124  vessel]
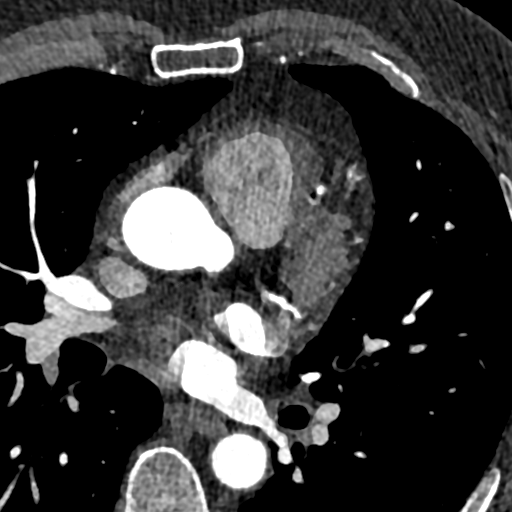

[3 of 20 positions shown; findings below may reference images not displayed]

:
A retrospective scan was triggered in the descending thoracic aorta.
Axial non-contrast 3 mm slices were carried out through the heart.
The data set was analyzed on a dedicated work station and scored
using the Agatson method. Gantry rotation speed was 330 msecs and
collimation was .6 mm. 100mg of metoprolol and 0.8 mg of sl NTG was
given. The 3D data set was reconstructed in 5% intervals of the
60-95 % of the R-R cycle. Diastolic phases were analyzed on a
dedicated work station using MPR, MIP and VRT modes. The patient
received 75 cc of contrast.
FINDINGS: Aorta: Normal size. Mild aortic root and descending aorta
calcifications. No dissection.

Aortic Valve:  Trileaflet.  No calcifications.

Coronary Arteries:  Normal coronary origin.  Right dominance.

RCA is a dominant artery that gives rise to PDA and PLA. There is
calcified plaque in the proximal and mid RCA causing mild stenosis
(25%).

Left main gives rise to LAD and LCX arteries.  LM has no disease

LAD has heavily calcified plaque in the proximal segment causing
severe stenosis (>70%).

LCX is a non-dominant artery that gives rise to three obtuse
marginal branches. There is calcified plaque in the ostial OM1
causing severe stenosis (>70%).

Other findings:

Normal pulmonary vein drainage into the left atrium.

Normal left atrial appendage without a thrombus.

Normal size of the pulmonary artery.
IMPRESSION: 1. Coronary calcium score of 5437. This was 97th percentile for age
and sex matched control.

2. Normal coronary origin with right dominance.

3. Calcified plaque causing severe stenosis (>70%) in the proximal
LAD and ostial OM1.

4. Mild stenosis in the mid RCA (25%).

5. CAD-RADS 4 Severe stenosis. (70-99% or > 50% left main). Cardiac
catheterization is recommended. Consider symptom-guided
anti-ischemic pharmacotherapy as well as risk factor modification
per guideline directed care.

6. Additional analysis with CT FFR will be submitted and reported
separately.
FINDINGS: No suspicious nodules, masses, or infiltrates are identified in the
visualized portion of the lungs. No pleural fluid seen.

The visualized portions of the mediastinum and chest wall are
unremarkable. A few tiny benign cysts are noted in the visualized
portion of the liver.
IMPRESSION: No significant non-cardiac abnormality identified.

*** End of Addendum ***
:
A retrospective scan was triggered in the descending thoracic aorta.
Axial non-contrast 3 mm slices were carried out through the heart.
The data set was analyzed on a dedicated work station and scored
using the Agatson method. Gantry rotation speed was 330 msecs and
collimation was .6 mm. 100mg of metoprolol and 0.8 mg of sl NTG was
given. The 3D data set was reconstructed in 5% intervals of the
60-95 % of the R-R cycle. Diastolic phases were analyzed on a
dedicated work station using MPR, MIP and VRT modes. The patient
received 75 cc of contrast.
FINDINGS: Aorta: Normal size. Mild aortic root and descending aorta
calcifications. No dissection.

Aortic Valve:  Trileaflet.  No calcifications.

Coronary Arteries:  Normal coronary origin.  Right dominance.

RCA is a dominant artery that gives rise to PDA and PLA. There is
calcified plaque in the proximal and mid RCA causing mild stenosis
(25%).

Left main gives rise to LAD and LCX arteries.  LM has no disease

LAD has heavily calcified plaque in the proximal segment causing
severe stenosis (>70%).

LCX is a non-dominant artery that gives rise to three obtuse
marginal branches. There is calcified plaque in the ostial OM1
causing severe stenosis (>70%).

Other findings:

Normal pulmonary vein drainage into the left atrium.

Normal left atrial appendage without a thrombus.

Normal size of the pulmonary artery.
IMPRESSION: 1. Coronary calcium score of 5437. This was 97th percentile for age
and sex matched control.

2. Normal coronary origin with right dominance.

3. Calcified plaque causing severe stenosis (>70%) in the proximal
LAD and ostial OM1.

4. Mild stenosis in the mid RCA (25%).

5. CAD-RADS 4 Severe stenosis. (70-99% or > 50% left main). Cardiac
catheterization is recommended. Consider symptom-guided
anti-ischemic pharmacotherapy as well as risk factor modification
per guideline directed care.

6. Additional analysis with CT FFR will be submitted and reported
separately.

## 2023-02-05 ENCOUNTER — Ambulatory Visit: Payer: PRIVATE HEALTH INSURANCE | Attending: Internal Medicine | Admitting: Internal Medicine

## 2023-02-05 ENCOUNTER — Encounter: Payer: Self-pay | Admitting: Internal Medicine

## 2023-02-05 VITALS — BP 118/84 | HR 74 | Ht 68.0 in | Wt 173.4 lb

## 2023-02-05 DIAGNOSIS — I10 Essential (primary) hypertension: Secondary | ICD-10-CM | POA: Diagnosis not present

## 2023-02-05 DIAGNOSIS — I2510 Atherosclerotic heart disease of native coronary artery without angina pectoris: Secondary | ICD-10-CM | POA: Diagnosis not present

## 2023-02-05 NOTE — Progress Notes (Signed)
Cardiology Office Note:    Date:  02/05/2023   ID:  Mark Pinto., DOB May 21, 1960, MRN [id number]  PCP:  Babs Sciara, MD   [facility] Providers Cardiologist:  Maisie Fus, MD     Referring MD: Babs Sciara, MD   No chief complaint on file. Chest pain    History of Present Illness:   09/20/2021 Mark Bussman. is a 62 y.o. male with a hx of squamous cell carcinoma of the skin referral for shortness of breath, chest discomfort   He reports chest tightness and worsening dyspnea most intense on Saturday. He was doing lawn work, moving tree branches around. He is feeling more shortness of breath with walking. This has been going on for approximately 3 weeks duration. At rest he has not chest discomfort. He has no symptoms with ordinary activities.  He reports a stress test ~ 20 years ago. His EKG shows mild LVH with repol. Cxray showed chronic interstitial lung dx. Normal renal function. Blood pressures controlled at home with occasional screening. His father had MI in his 50s.   Interim Hx 10/27/2021 Mark [name] underwent Celine Ahr 09/27/2021. His EKG showed inferolateral TWI worsened with stress. His METS were 9.7 which is a good exercise response. However his MPHR was 77% (optimal is 85%). No inducible ischemia or scar was identified. He continued to have symptoms and Dr. Gerda Diss was concerned. With suboptimal MPHR he suggested further risk stratification. I obtained a coronary CTA and he had FFR + proximal LAD and OM1 lesion. He is now planned for [facility] tomorrow 10/30/2021. He continues to have SOB with activity and chest tightness. No symptoms at rest. No bleeding issues. He is prepared for the procedure  Interim hx 02/05/2022 He underwent LHC showing MVD. 70-75% prox LAD, critical 95% D1, OM1 90%, moderate Lcx dx,. He was recommended for CABG x4V LIMA-LAD, SVG to OM, SVG to Diag, SVG PAV. Valsartan was stopped during hospitalization. BP normal today. LDL 103 mg/dL 10/30/3084.  Normal LFTs.  Interval hx 02/05/2023 He went to the beach this summer.  Past Medical History:  Diagnosis Date   Cancer (HCC) 15-20 years ago   squamous cell carcinoma off face   Coronary artery disease    COVID 04/2019   tested positive but no symptoms   GERD (gastroesophageal reflux disease)    Hypertension     Past Surgical History:  Procedure Laterality Date   BACK SURGERY     CATARACT EXTRACTION Left    COLONOSCOPY  03/19/2011   Procedure: COLONOSCOPY;  Surgeon: Malissa Hippo, MD;  Location: [facility] ENDO SUITE;  Service: Endoscopy;  Laterality: N/A;   CORONARY ARTERY BYPASS GRAFT N/A 11/13/2021   Procedure: CORONARY ARTERY BYPASS GRAFTING (CABG) X FOUR BYPASSES USING OPEN LEFT INTERNAL MAMMARY ARTERY AND ENDOSCOPIC RIGHT GREATER SAPHENOUS VEIN HARVEST.;  Surgeon: Corliss Skains, MD;  Location: [facility] OR;  Service: Open Heart Surgery;  Laterality: N/A;   EYE SURGERY Left    for hole in eye   LEFT HEART CATH AND CORONARY ANGIOGRAPHY N/A 10/30/2021   Procedure: LEFT HEART CATH AND CORONARY ANGIOGRAPHY;  Surgeon: Tonny Bollman, MD;  Location: [facility] INVASIVE CV LAB;  Service: Cardiovascular;  Laterality: N/A;   SHOULDER SURGERY Left    skin cancer removed from face     TEE WITHOUT CARDIOVERSION N/A 11/13/2021   Procedure: TRANSESOPHAGEAL ECHOCARDIOGRAM (TEE);  Surgeon: Corliss Skains, MD;  Location: [facility] OR;  Service: Open Heart Surgery;  Laterality: N/A;  UMBILICAL HERNIA REPAIR N/A 09/15/2014   Procedure: HERNIA REPAIR UMBILICAL ADULT;  Surgeon: III Tiney Rouge, MD;  Location: [facility] ORS;  Service: General;  Laterality: N/A;   VASECTOMY      Current Medications: Current Outpatient Medications on File Prior to Visit  Medication Sig Dispense Refill   albuterol (VENTOLIN HFA) 108 (90 Base) MCG/ACT inhaler Inhale 2 puffs into the lungs every 4 (four) hours as needed for wheezing or shortness of breath. 8 g 4   aspirin EC 81 MG tablet Take 1 tablet (81 mg total) by mouth daily. Swallow  whole. 90 tablet 3   ezetimibe (ZETIA) 10 MG tablet Take 1 tablet (10 mg total) by mouth daily. 90 tablet 3   famotidine (PEPCID) 20 MG tablet Take 1 tablet (20 mg total) by mouth 2 (two) times daily. 180 tablet 3   fluticasone (FLONASE) 50 MCG/ACT nasal spray Place 2 sprays into both nostrils 2 (two) times daily as needed for allergies or rhinitis. 16 g 0   metoprolol succinate (TOPROL XL) 50 MG 24 hr tablet Take 1 tablet (50 mg total) by mouth daily. Take with or immediately following a meal. 90 tablet 3   Multiple Vitamins-Minerals (MULTIVITAMINS THER. W/MINERALS) TABS Take 1 tablet by mouth daily.     Multiple Vitamins-Minerals (PRESERVISION AREDS PO) Take 1 Capful by mouth in the morning and at bedtime.     nitroGLYCERIN (NITROSTAT) 0.4 MG SL tablet Place 1 tablet (0.4 mg total) under the tongue every 5 (five) minutes as needed for chest pain. 90 tablet 3   rosuvastatin (CRESTOR) 40 MG tablet Take 1 tablet (40 mg total) by mouth daily. 90 tablet 3   No current facility-administered medications on file prior to visit.    Allergies:   Patient has no known allergies.   Social History   Socioeconomic History   Marital status: Married    Spouse name: Not on file   Number of children: Not on file   Years of education: Not on file   Highest education level: Not on file  Occupational History   Not on file  Tobacco Use   Smoking status: Former    Current packs/day: 0.00    Average packs/day: 1 pack/day for 15.3 years (15.3 ttl pk-yrs)    Types: Cigarettes    Start date: 41    Quit date: 09/04/1996    Years since quitting: 26.4   Smokeless tobacco: Never  Vaping Use   Vaping status: Not on file  Substance and Sexual Activity   Alcohol use: Yes    Comment: Rarely   Drug use: No   Sexual activity: Yes  Other Topics Concern   Not on file  Social History Narrative   ** Merged History Encounter **       Social Determinants of Health   Financial Resource Strain: Not on file   Food Insecurity: Not on file  Transportation Needs: Not on file  Physical Activity: Not on file  Stress: Not on file  Social Connections: Not on file     Family History: The patient's family history includes Heart disease in his father; Pulmonary fibrosis in his father. There is no history of Colon cancer.  ROS:   Please see the history of present illness.     All other systems reviewed and are negative.  EKGs/Labs/Other Studies Reviewed:    The following studies were reviewed today:   EKG:  EKG is  ordered today.  The ekg ordered today demonstrates  09/20/2021-NSR, LVH  EKG Interpretation Date/Time:  Thursday February 05 2023 13:09:57 EDT Ventricular Rate:  74 PR Interval:  188 QRS Duration:  80 QT Interval:  384 QTC Calculation: 426 R Axis:   5  Text Interpretation: Normal sinus rhythm Inferior infarct , age undetermined When compared with ECG of 14-Nov-2021 06:44, Inferior infarct is now Present ST no longer elevated in Inferior leads ST no longer elevated in Lateral leads Confirmed by Carolan Clines [phone]) on 02/05/2023 1:21:23 PM   Recent Labs: 01/19/2023: ALT 70; BUN 17; Creatinine, Ser 1.03; Hemoglobin 14.9; Platelets 219; Potassium 4.3; Sodium 141   Recent Lipid Panel    Component Value Date/Time   CHOL 163 01/19/2023 0800   TRIG 66 01/19/2023 0800   HDL 76 01/19/2023 0800   CHOLHDL 2.1 01/19/2023 0800   CHOLHDL 3.0 07/02/2021 0830   VLDL 13 07/14/2013 0704   LDLCALC 74 01/19/2023 0800   LDLCALC 93 07/02/2021 0830     Risk Assessment/Calculations:     The 10-year ASCVD risk score (Arnett DK, et al., 2019) is: 7.1%   Values used to calculate the score:     Age: 62 years     Sex: Male     Is Non-Hispanic African American: No     Diabetic: No     Tobacco smoker: No     Systolic Blood Pressure: 116 mmHg     Is BP treated: Yes     HDL Cholesterol: 76 mg/dL     Total Cholesterol: 163 mg/dL       Physical Exam:    VS:   Vitals:   02/05/23 1305   BP: 118/84  Pulse: 74  SpO2: 94%     Wt Readings from Last 3 Encounters:  01/19/23 174 lb 3.2 oz (79 kg)  11/18/22 175 lb (79.4 kg)  02/05/22 178 lb 6.4 oz (80.9 kg)     Physical Exam Gen: well appearing Neuro: alert and oriented CV: r,r,r no murmurs. 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 With CVD risk (age, family hx, htn), s/p exercise nuclear stress was sub-optimal per above. Coronary CTA showed + FFR prox LAD and OM1 lesion.  Continue asa and crestor 40 mg daily. Goal LDL at least < 70 mg/dL, not very high risk ASCVD. LHC showed MVD dx per above. S/p CABGx4.  A1c 5.8 - LDL 74 mg/dL, near goal - continue  zetia 10 mg daily and crestor 40 mg daily - cont asa 81 mg daily - change metop tartrate 25 mg BID  to 50 mg XL daily - nitro SL PRN  HTN: well controlled.  Continue metop  PLAN:    In order of problems listed above:  Follow up in 1 year     Medication Adjustments/Labs and Tests Ordered: Current medicines are reviewed at length with the patient today.  Concerns regarding medicines are outlined above.  No orders of the defined types were placed in this encounter.  No orders of the defined types were placed in this encounter.   There are no Patient Instructions on file for this visit.   Signed, Maisie Fus, MD  02/05/2023 8:01 AM    [facility]

## 2023-02-05 NOTE — Patient Instructions (Signed)
Medication Instructions:  Your physician recommends that you continue on your current medications as directed. Please refer to the Current Medication list given to you today.  *If you need a refill on your cardiac medications before your next appointment, please call your pharmacy*   Lab Work: None  If you have labs (blood work) drawn today and your tests are completely normal, you will receive your results only by: MyChart Message (if you have MyChart) OR A paper copy in the mail If you have any lab test that is abnormal or we need to change your treatment, we will call you to review the results.   Testing/Procedures: None   Follow-Up: At [facility], you and your health needs are our priority.  As part of our continuing mission to provide you with exceptional heart care, we have created designated Provider Care Teams.  These Care Teams include your primary Cardiologist (physician) and Advanced Practice Providers (APPs -  Physician Assistants and Nurse Practitioners) who all work together to provide you with the care you need, when you need it.  We recommend signing up for the patient portal called "MyChart".  Sign up information is provided on this After Visit Summary.  MyChart is used to connect with patients for Virtual Visits (Telemedicine).  Patients are able to view lab/test results, encounter notes, upcoming appointments, etc.  Non-urgent messages can be sent to your provider as well.   To learn more about what you can do with MyChart, go to [email or web address].    Your next appointment:   1 year(s)  Provider:   Maisie Fus, MD

## 2023-02-06 ENCOUNTER — Other Ambulatory Visit: Payer: Self-pay | Admitting: Internal Medicine

## 2023-02-17 ENCOUNTER — Ambulatory Visit
Admission: EM | Admit: 2023-02-17 | Discharge: 2023-02-17 | Disposition: A | Discharge status: Home / Self Care | Payer: PRIVATE HEALTH INSURANCE | Attending: Nurse Practitioner | Admitting: Nurse Practitioner

## 2023-02-17 ENCOUNTER — Ambulatory Visit: Payer: PRIVATE HEALTH INSURANCE

## 2023-02-17 DIAGNOSIS — M25511 Pain in right shoulder: Secondary | ICD-10-CM

## 2023-02-17 NOTE — Discharge Instructions (Addendum)
I do not see anything obvious on the shoulder x-ray from today to explain the pain.  As we discussed, I suspect your rotator cuff has been entered.  I will contact you later today if the shoulder x-ray shows something we will need to change treatment.  In the meantime, recommend ice 15 minutes on, 45 minutes off every hour while awake.  You can continue Tylenol 500 to 1000 mg every 6 hours for pain.  You can also start the shoulder exercises I have attached to this packet.  Recommend follow-up with orthopedic provider if symptoms do not improve with this treatment.

## 2023-02-17 NOTE — ED Provider Notes (Signed)
[facility]    CSN: [id number] Arrival date & time: 02/17/23  3220      History   Chief Complaint No chief complaint on file.   HPI Mark [name]. is a 62 y.o. male.   Patient presents today with right shoulder pain that began yesterday after he was moving an object about 30 pounds from his right side and twisting it over the left side of his body.  Reports he felt a pop initially and began having pain.  He denies swelling, bruising, or redness to the shoulder.  No numbness or tingling in the fingertips.  He took Tylenol for the pain with some improvement.  Denies history of surgeries to the right shoulder.    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   Acute postoperative respiratory insufficiency    CAD, multiple vessel    Primary hypertension 06/06/2021   Injury of left shoulder 05/10/2020   Vitreous floaters of right eye 12/31/2015   GERD (gastroesophageal reflux disease) 12/31/2015   Hyperlipidemia 07/19/2013    Past Surgical History:  Procedure Laterality Date   BACK SURGERY     CATARACT EXTRACTION Left    COLONOSCOPY  03/19/2011   Procedure: COLONOSCOPY;  Surgeon: Malissa Hippo, MD;  [facility];  Service: Endoscopy;  Laterality: N/A;   CORONARY ARTERY BYPASS GRAFT N/A 11/13/2021   Procedure: CORONARY ARTERY BYPASS GRAFTING (CABG) X FOUR BYPASSES USING OPEN LEFT INTERNAL MAMMARY ARTERY AND ENDOSCOPIC RIGHT GREATER SAPHENOUS VEIN HARVEST.;  Surgeon: Corliss Skains, MD;  Location: [facility] OR;  Service: Open Heart Surgery;  Laterality: N/A;   EYE SURGERY Left    for hole in eye   LEFT HEART CATH AND CORONARY ANGIOGRAPHY N/A 10/30/2021   Procedure: LEFT HEART  CATH AND CORONARY ANGIOGRAPHY;  Surgeon: Tonny Bollman, MD;  Location: [facility] INVASIVE CV LAB;  Service: Cardiovascular;  Laterality: N/A;   SHOULDER SURGERY Left    skin cancer removed from face     TEE WITHOUT CARDIOVERSION N/A 11/13/2021   Procedure: TRANSESOPHAGEAL ECHOCARDIOGRAM (TEE);  Surgeon: Corliss Skains, MD;  Location: [facility] OR;  Service: Open Heart Surgery;  Laterality: N/A;   UMBILICAL HERNIA REPAIR N/A 09/15/2014   Procedure: HERNIA REPAIR UMBILICAL ADULT;  Surgeon: III Tiney Rouge, MD;  Location: [facility] ORS;  Service: General;  Laterality: N/A;   VASECTOMY         Home Medications    Prior to Admission medications   Medication Sig Start Date End Date Taking? Authorizing Provider  albuterol (VENTOLIN HFA) 108 (90 Base) MCG/ACT inhaler Inhale 2 puffs into the lungs every 4 (four) hours as needed for wheezing or shortness of breath. Patient not taking: Reported on 02/05/2023 10/03/21   Charlott Holler, MD  aspirin EC 81 MG tablet Take 1 tablet (81 mg total) by mouth daily. Swallow whole. 02/05/22   Maisie Fus, MD  ezetimibe (ZETIA) 10 MG tablet TAKE 1 TABLET BY MOUTH EVERY DAY 02/06/23   Maisie Fus, MD  famotidine (PEPCID) 20 MG tablet Take 1 tablet (20 mg total) by mouth 2 (two) times daily. 01/19/23   Lilyan Punt  A, MD  fluticasone (FLONASE) 50 MCG/ACT nasal spray Place 2 sprays into both nostrils 2 (two) times daily as needed for allergies or rhinitis. 05/20/22   Gloris Ham, NP  metoprolol succinate (TOPROL-XL) 50 MG 24 hr tablet TAKE 1 TABLET BY MOUTH DAILY. TAKE WITH OR IMMEDIATELY FOLLOWING A MEAL. 02/06/23   Maisie Fus, MD  Multiple Vitamins-Minerals (MULTIVITAMINS THER. W/MINERALS) TABS Take 1 tablet by mouth daily.    [provider]  Multiple Vitamins-Minerals (PRESERVISION AREDS PO) Take 1 Capful by mouth in the morning and at bedtime.    [provider]  nitroGLYCERIN (NITROSTAT) 0.4 MG SL tablet Place 1 tablet (0.4 mg total) under the  tongue every 5 (five) minutes as needed for chest pain. 02/05/22 05/06/22  Maisie Fus, MD  rosuvastatin (CRESTOR) 40 MG tablet Take 1 tablet (40 mg total) by mouth daily. 01/19/23   Babs Sciara, MD    Family History Family History  Problem Relation Age of Onset   Heart disease Father    Pulmonary fibrosis Father    Colon cancer Neg Hx     Social History Social History   Tobacco Use   Smoking status: Former    Current packs/day: 0.00    Average packs/day: 1 pack/day for 15.3 years (15.3 ttl pk-yrs)    Types: Cigarettes    Start date: 5    Quit date: 09/04/1996    Years since quitting: 26.4   Smokeless tobacco: Never  Substance Use Topics   Alcohol use: Yes    Comment: Rarely   Drug use: No     Allergies   Patient has no known allergies.   Review of Systems Review of Systems Per HPI  Physical Exam Triage Vital Signs ED Triage Vitals  Encounter Vitals Group     BP 02/17/23 1034 (!) 178/67     Systolic BP Percentile --      Diastolic BP Percentile --      Pulse Rate 02/17/23 1034 81     Resp 02/17/23 1034 17     Temp 02/17/23 1034 98.2 F (36.8 C)     Temp Source 02/17/23 1034 Oral     SpO2 02/17/23 1034 95 %     Weight --      Height --      Head Circumference --      Peak Flow --      Pain Score 02/17/23 1035 9     Pain Loc --      Pain Education --      Exclude from Growth Chart --    No data found.  Updated Vital Signs BP (!) 178/67 (BP Location: Right Arm)   Pulse 81   Temp 98.2 F (36.8 C) (Oral)   Resp 17   SpO2 95%   Visual Acuity Right Eye Distance:   Left Eye Distance:   Bilateral Distance:    Right Eye Near:   Left Eye Near:    Bilateral Near:     Physical Exam Musculoskeletal:     Comments: Inspection: no swelling, bruising, obvious deformity or redness to right shoulder Palpation: right upper extremity is nontender to palpation diffusely; no obvious deformities palpated ROM: Patient has difficulty with passive front  abduction of right shoulder Strength: 5/5 bilateral upper extremities Neurovascular: neurovascularly intact in bilateral upper extremities       UC Treatments / Results  Labs (all labs ordered are listed, but only abnormal results are displayed) Labs Reviewed -  No data to display  EKG   Radiology No results found.  Procedures Procedures (including critical care time)  Medications Ordered in UC Medications - No data to display  Initial Impression / Assessment and Plan / UC Course  I have reviewed the triage vital signs and the nursing notes.  Pertinent labs & imaging results that were available during my care of the patient were reviewed by me and considered in my medical decision making (see chart for details).   Patient is well-appearing, afebrile, not tachycardic, not tachypneic, oxygenating well on room air.  Patient is mildly hypertensive in urgent care today.  1. Acute pain of right shoulder No red flags; vitals and exam reassuring Suspect rotator cuff injury X-ray imaging not officially read by radiologist by time of discharge; reviewed with myself and P. Marlinda Mike, MD without obvious bony abnormalities Will contact patient later today if we need to change treatment based on the x-ray results In the meantime, start light range of motion/routine exercises, ice to the area, continue Tylenol Patient not a good candidate for NSAIDs given cardiac history Recommended follow-up with orthopedic provider if symptoms do not improve with this treatment or if they worsen  The patient was given the opportunity to ask questions.  All questions answered to their satisfaction.  The patient is in agreement to this plan.    Final Clinical Impressions(s) / UC Diagnoses   Final diagnoses:  Acute pain of right shoulder     Discharge Instructions      I do not see anything obvious on the shoulder x-ray from today to explain the pain.  As we discussed, I suspect your rotator cuff has  been entered.  I will contact you later today if the shoulder x-ray shows something we will need to change treatment.  In the meantime, recommend ice 15 minutes on, 45 minutes off every hour while awake.  You can continue Tylenol 500 to 1000 mg every 6 hours for pain.  You can also start the shoulder exercises I have attached to this packet.  Recommend follow-up with orthopedic provider if symptoms do not improve with this treatment.    ED Prescriptions   None    PDMP not reviewed this encounter.   Valentino Nose, NP 02/17/23 1249

## 2023-02-17 NOTE — ED Triage Notes (Signed)
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 This took place late yesterday evening. Pt states he has taken tylenol but it did not help with the pain.

## 2023-03-09 ENCOUNTER — Telehealth: Payer: Self-pay

## 2023-03-09 DIAGNOSIS — M25519 Pain in unspecified shoulder: Secondary | ICD-10-CM

## 2023-03-09 NOTE — Telephone Encounter (Signed)
Please go ahead with referral 

## 2023-03-09 NOTE — Telephone Encounter (Signed)
Pt needs referral to [facility]  Shoulder pain went to Urgent Care three weeks ago not improving and seen this orthopedic before.   [phone]

## 2023-03-10 ENCOUNTER — Ambulatory Visit: Payer: PRIVATE HEALTH INSURANCE | Admitting: Nurse Practitioner

## 2023-03-10 DIAGNOSIS — R7989 Other specified abnormal findings of blood chemistry: Secondary | ICD-10-CM

## 2023-03-10 NOTE — Telephone Encounter (Signed)
Referral ordered in EPIC. 

## 2023-03-12 ENCOUNTER — Encounter: Payer: Self-pay | Admitting: Family Medicine

## 2023-03-12 NOTE — Progress Notes (Signed)
Patient present for labs ordered by PCP.

## 2023-03-12 NOTE — Progress Notes (Signed)
Please mail.

## 2023-03-14 LAB — HEPATIC FUNCTION PANEL
ALT: 54 [IU]/L — ABNORMAL HIGH (ref 0–44)
AST: 38 [IU]/L (ref 0–40)
Albumin: 4.5 g/dL (ref 3.9–4.9)
Alkaline Phosphatase: 68 [IU]/L (ref 44–121)
Bilirubin Total: 0.3 mg/dL (ref 0.0–1.2)
Bilirubin, Direct: <0.1 mg/dL (ref 0.00–0.40)
Total Protein: 7.4 g/dL (ref 6.0–8.5)

## 2023-04-15 ENCOUNTER — Other Ambulatory Visit: Payer: Self-pay | Admitting: Orthopaedic Surgery

## 2023-04-15 DIAGNOSIS — G8929 Other chronic pain: Secondary | ICD-10-CM

## 2023-04-16 ENCOUNTER — Telehealth: Payer: Self-pay | Admitting: *Deleted

## 2023-04-16 NOTE — Telephone Encounter (Signed)
   Pre-operative Risk Assessment    Patient Name: Mark [name].  DOB: Apr 28, 1961 MRN: [id number]  DATE OF LAST VISIT: 02/05/23 DR. MARY BRANCH DATE OF NEXT VISIT: NONE    Request for Surgical Clearance    Procedure:   RIGHT REVERSE TOTAL SHOULDER ARTHROPLASTY  Date of Surgery:  Clearance TBD                                 Surgeon:  DR. Ramond Marrow Surgeon's Group or Practice Name:  [facility] Phone number:  [phone] EXT [address] Fax number:  [phone]   Type of Clearance Requested:   - Medical  - Pharmacy:  Hold Aspirin     Type of Anesthesia:  General  WITH INTERSCALENE BLOCK   Additional requests/questions:    Elpidio Anis   04/16/2023, 6:08 PM

## 2023-04-17 NOTE — Telephone Encounter (Signed)
   Name: Mark [name].  DOB: January 09, 1961  MRN: [id number]  Primary Cardiologist: Maisie Fus, MD   Preoperative team, please contact this patient and set up a phone call appointment for further preoperative risk assessment. Please obtain consent and complete medication review. Thank you for your help.  I confirm that guidance regarding antiplatelet and oral anticoagulation therapy has been completed and, if necessary, noted below.  His aspirin should be continued throughout the perioperative period.  If bleeding risk is felt to be too high his aspirin may be held for 5-7 days prior to his procedure.  Please resume as soon as possible once hemostasis is achieved.  I also confirmed the patient resides in the state of [place]. As per [facility] Medical Board telemedicine laws, the patient must reside in the state in which the provider is licensed.   Ronney Asters, NP 04/17/2023, 8:27 AM  [facility]

## 2023-04-20 ENCOUNTER — Telehealth: Payer: Self-pay | Admitting: *Deleted

## 2023-04-20 NOTE — Progress Notes (Unsigned)
Virtual Visit via Telephone Note   Because of Mark NEYLON Jr.'s co-morbid illnesses, he is at least at moderate risk for complications without adequate follow up.  This format is felt to be most appropriate for this patient at this time.  The patient did not have access to video technology/had technical difficulties with video requiring transitioning to audio format only (telephone).  All issues noted in this document were discussed and addressed.  No physical exam could be performed with this format.  Please refer to the patient's chart for his consent to telehealth for [facility].  Evaluation Performed:  Preoperative cardiovascular risk assessment _____________   Date:  04/20/2023   Patient ID:  Mark [name]., DOB 1960-12-26, MRN [id number] Patient Location:  Home Provider location:   Office  Primary Care Provider:  Babs Sciara, MD Primary Cardiologist:  Maisie Fus, MD  Chief Complaint / Patient Profile   62 y.o. y/o male with a h/o coronary artery disease, status post CABG x 4 with LIMA to LAD, SVG to OM, SVG to diagonal, SVG to BAV in 2023, ischemic heart disease, hypertension hypercholesterolemia, and squamous cell carcinoma of the skin.    He is pending right reverse total shoulder arthroplasty, by Dr. Everardo Pacific on date to be determined with request concerning holding aspirin. He presents today for telephonic preoperative cardiovascular risk assessment.  History of Present Illness    Mark [name]. is a 62 y.o. male who presents via audio/video conferencing for a telehealth visit today.  Pt was last seen in cardiology clinic on 02/05/2019 for by Dr. Carolan Clines.  At that time Mark [name]. was doing well, blood pressure was well-controlled, he continued to have class II angina, continued on statin therapy.  The patient is now pending procedure as outlined above. Since his last visit, he ***  Past Medical History    Past Medical History:   Diagnosis Date   Cancer (HCC) 15-20 years ago   squamous cell carcinoma off face   Coronary artery disease    COVID 04/2019   tested positive but no symptoms   GERD (gastroesophageal reflux disease)    Hypertension    Past Surgical History:  Procedure Laterality Date   BACK SURGERY     CATARACT EXTRACTION Left    COLONOSCOPY  03/19/2011   Procedure: COLONOSCOPY;  Surgeon: Malissa Hippo, MD;  [facility];  Service: Endoscopy;  Laterality: N/A;   CORONARY ARTERY BYPASS GRAFT N/A 11/13/2021   Procedure: CORONARY ARTERY BYPASS GRAFTING (CABG) X FOUR BYPASSES USING OPEN LEFT INTERNAL MAMMARY ARTERY AND ENDOSCOPIC RIGHT GREATER SAPHENOUS VEIN HARVEST.;  Surgeon: Corliss Skains, MD;  Location: [facility] OR;  Service: Open Heart Surgery;  Laterality: N/A;   EYE SURGERY Left    for hole in eye   LEFT HEART CATH AND CORONARY ANGIOGRAPHY N/A 10/30/2021   Procedure: LEFT HEART CATH AND CORONARY ANGIOGRAPHY;  Surgeon: Tonny Bollman, MD;  Location: [facility] INVASIVE CV LAB;  Service: Cardiovascular;  Laterality: N/A;   SHOULDER SURGERY Left    skin cancer removed from face     TEE WITHOUT CARDIOVERSION N/A 11/13/2021   Procedure: TRANSESOPHAGEAL ECHOCARDIOGRAM (TEE);  Surgeon: Corliss Skains, MD;  Location: [facility] OR;  Service: Open Heart Surgery;  Laterality: N/A;   UMBILICAL HERNIA REPAIR N/A 09/15/2014   Procedure: HERNIA REPAIR UMBILICAL ADULT;  Surgeon: III Tiney Rouge, MD;  Location: [facility] ORS;  Service: General;  Laterality: N/A;  VASECTOMY      Allergies  No Known Allergies  Home Medications    Prior to Admission medications   Medication Sig Start Date End Date Taking? Authorizing Provider  albuterol (VENTOLIN HFA) 108 (90 Base) MCG/ACT inhaler Inhale 2 puffs into the lungs every 4 (four) hours as needed for wheezing or shortness of breath. 10/03/21   Charlott Holler, MD  aspirin EC 81 MG tablet Take 1 tablet (81 mg total) by mouth daily. Swallow whole. 02/05/22   Maisie Fus, MD  ezetimibe (ZETIA) 10 MG tablet TAKE 1 TABLET BY MOUTH EVERY DAY 02/06/23   Maisie Fus, MD  famotidine (PEPCID) 20 MG tablet Take 1 tablet (20 mg total) by mouth 2 (two) times daily. 01/19/23   Babs Sciara, MD  fluticasone (FLONASE) 50 MCG/ACT nasal spray Place 2 sprays into both nostrils 2 (two) times daily as needed for allergies or rhinitis. 05/20/22   Gloris Ham, NP  metoprolol succinate (TOPROL-XL) 50 MG 24 hr tablet TAKE 1 TABLET BY MOUTH DAILY. TAKE WITH OR IMMEDIATELY FOLLOWING A MEAL. 02/06/23   Maisie Fus, MD  Multiple Vitamins-Minerals (MULTIVITAMINS THER. W/MINERALS) TABS Take 1 tablet by mouth daily.    [provider]  Multiple Vitamins-Minerals (PRESERVISION AREDS PO) Take 1 Capful by mouth in the morning and at bedtime.    [provider]  nitroGLYCERIN (NITROSTAT) 0.4 MG SL tablet Place 1 tablet (0.4 mg total) under the tongue every 5 (five) minutes as needed for chest pain. 02/05/22 05/06/22  Maisie Fus, MD  rosuvastatin (CRESTOR) 40 MG tablet Take 1 tablet (40 mg total) by mouth daily. 01/19/23   Babs Sciara, MD    Physical Exam    Vital Signs:  Mark [name]. does not have vital signs available for review today.***  Given telephonic nature of communication, physical exam is limited. AAOx3. NAD. Normal affect.  Speech and respirations are unlabored.  Accessory Clinical Findings    None  Assessment & Plan    1.  Preoperative Cardiovascular Risk Assessment:  The patient was advised that if he develops new symptoms prior to surgery to contact our office to arrange for a follow-up visit, and he verbalized understanding. According to the Revised Cardiac Risk Index (RCRI), his    His   according to the [facility] Activity Status Index (DASI).   Per office protocol, if patient is without any new symptoms or concerns at the time of their virtual visit, he/she may hold  ASA for 7 days prior to procedure. Please resume ASA as soon as  possible postprocedure, at the discretion of the surgeon.    Therefore, based on ACC/AHA guidelines, patient would be at acceptable risk for the planned procedure without further cardiovascular testing. I will route this recommendation to the requesting party via Epic fax function.   A copy of this note will be routed to requesting surgeon.  Time:   Today, I have spent *** minutes with the patient with telehealth technology discussing medical history, symptoms, and management plan.     Joni Reining, NP  04/20/2023, 10:57 AM

## 2023-04-20 NOTE — Telephone Encounter (Signed)
Pt has been scheduled tele pre op appt 04/22/23. Per pt surgeon is just waiting on our office to schedule the surgery. Med rec and consent are done.      Patient Consent for Virtual Visit        Mark Witter. has provided verbal consent on 04/20/2023 for a virtual visit (video or telephone).   CONSENT FOR VIRTUAL VISIT FOR:  Mark [name].  By participating in this virtual visit I agree to the following:  I hereby voluntarily request, consent and authorize [facility] and its employed or contracted physicians, physician assistants, nurse practitioners or other licensed health care professionals (the Practitioner), to provide me with telemedicine health care services (the "Services") as deemed necessary by the treating Practitioner. I acknowledge and consent to receive the Services by the Practitioner via telemedicine. I understand that the telemedicine visit will involve communicating with the Practitioner through live audiovisual communication technology and the disclosure of certain medical information by electronic transmission. I acknowledge that I have been given the opportunity to request an in-person assessment or other available alternative prior to the telemedicine visit and am voluntarily participating in the telemedicine visit.  I understand that I have the right to withhold or withdraw my consent to the use of telemedicine in the course of my care at any time, without affecting my right to future care or treatment, and that the Practitioner or I may terminate the telemedicine visit at any time. I understand that I have the right to inspect all information obtained and/or recorded in the course of the telemedicine visit and may receive copies of available information for a reasonable fee.  I understand that some of the potential risks of receiving the Services via telemedicine include:  Delay or interruption in medical evaluation due to technological equipment failure  or disruption; Information transmitted may not be sufficient (e.g. poor resolution of images) to allow for appropriate medical decision making by the Practitioner; and/or  In rare instances, security protocols could fail, causing a breach of personal health information.  Furthermore, I acknowledge that it is my responsibility to provide information about my medical history, conditions and care that is complete and accurate to the best of my ability. I acknowledge that Practitioner's advice, recommendations, and/or decision may be based on factors not within their control, such as incomplete or inaccurate data provided by me or distortions of diagnostic images or specimens that may result from electronic transmissions. I understand that the practice of medicine is not an exact science and that Practitioner makes no warranties or guarantees regarding treatment outcomes. I acknowledge that a copy of this consent can be made available to me via my patient portal [facility] MyChart), or I can request a printed copy by calling the office of [facility].    I understand that my insurance will be billed for this visit.   I have read or had this consent read to me. I understand the contents of this consent, which adequately explains the benefits and risks of the Services being provided via telemedicine.  I have been provided ample opportunity to ask questions regarding this consent and the Services and have had my questions answered to my satisfaction. I give my informed consent for the services to be provided through the use of telemedicine in my medical care

## 2023-04-20 NOTE — Telephone Encounter (Signed)
Pt has been scheduled tele pre op appt 04/22/23. Per pt surgeon is just waiting on our office to schedule the surgery. Med rec and consent are done.

## 2023-04-22 ENCOUNTER — Ambulatory Visit
Admission: RE | Admit: 2023-04-22 | Discharge: 2023-04-22 | Disposition: A | Discharge status: Home / Self Care | Payer: Self-pay | Source: Ambulatory Visit | Attending: Orthopaedic Surgery | Admitting: Orthopaedic Surgery

## 2023-04-22 ENCOUNTER — Ambulatory Visit: Payer: PRIVATE HEALTH INSURANCE | Attending: Cardiology

## 2023-04-22 DIAGNOSIS — G8929 Other chronic pain: Secondary | ICD-10-CM

## 2023-04-22 DIAGNOSIS — Z01810 Encounter for preprocedural cardiovascular examination: Secondary | ICD-10-CM | POA: Diagnosis not present

## 2023-04-22 DIAGNOSIS — Z01818 Encounter for other preprocedural examination: Secondary | ICD-10-CM

## 2023-05-05 ENCOUNTER — Ambulatory Visit: Payer: PRIVATE HEALTH INSURANCE | Admitting: Family Medicine

## 2023-05-05 VITALS — BP 118/78 | HR 85 | Temp 98.4°F | Ht 68.0 in | Wt 174.0 lb

## 2023-05-05 DIAGNOSIS — M25511 Pain in right shoulder: Secondary | ICD-10-CM

## 2023-05-05 DIAGNOSIS — E785 Hyperlipidemia, unspecified: Secondary | ICD-10-CM

## 2023-05-05 DIAGNOSIS — I10 Essential (primary) hypertension: Secondary | ICD-10-CM

## 2023-05-05 DIAGNOSIS — M25519 Pain in unspecified shoulder: Secondary | ICD-10-CM

## 2023-05-05 NOTE — Progress Notes (Signed)
   Subjective:    Patient ID: Mark [name]., male    DOB: 07/29/1960, 62 y.o.   MRN: [id number]  HPI Patient presents follow-up Had a recent injury of his right shoulder they recommended complete replacement He is able to do majority of his work as long as it does not involve heavy lifting on the right shoulder he does relate compliance with his medicine has a history of heart disease and bypass patient denies any chest tightness pressure pain or shortness of breath no angina symptoms is able to do activities that exceed 4 METS   Review of Systems     Objective:   Physical Exam General-in no acute distress Eyes-no discharge Lungs-respiratory rate normal, CTA CV-no murmurs,RRR Extremities skin warm dry no edema Neuro grossly normal Behavior normal, alert        Assessment & Plan:  Cardiology notes reviewed Medications labs reviewed Patient is medically stable and is capable of undergoing shoulder surgery Considered to be mild to moderate risk Patient does have cardiology approval for surgery Patient to follow-up here if any problems otherwise follow-up every 6 months We will forward the form to his orthopedist

## 2023-07-21 ENCOUNTER — Encounter (INDEPENDENT_AMBULATORY_CARE_PROVIDER_SITE_OTHER): Payer: Self-pay | Admitting: *Deleted

## 2023-09-15 ENCOUNTER — Ambulatory Visit: Payer: Self-pay | Admitting: Nurse Practitioner

## 2023-09-15 ENCOUNTER — Encounter: Payer: Self-pay | Admitting: Nurse Practitioner

## 2023-09-15 VITALS — BP 124/80 | HR 71 | Temp 97.5°F

## 2023-09-15 DIAGNOSIS — Z789 Other specified health status: Secondary | ICD-10-CM

## 2023-09-15 NOTE — Progress Notes (Signed)
 Occupational [facility]- [facility]  Subjective:  Patient ID: Mark Mainer., male    DOB: 01/01/1961  Age: 63 y.o. MRN: [id number]  CC: Wellness Exam   HPI Mark Ninnemann. presents for wellness exam visit for insurance benefit.  Patient has a PCP: Dr. Fairy Homer PMH significant for: CABG,  HLD Last labs per PCP were completed; September 2024  Health Maintenance:  Colonoscopy: due for this. He is interested in completing this     Smoker: former  Immunizations:  Shingrix-  completed  Flu- yearly.  Tdap- completed 2021    Lifestyle: Diet- eating better  Exercise- recent shoulder surgery. 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      Past Medical History:  Diagnosis Date   Cancer (HCC) 15-20 years ago   squamous cell carcinoma off face   Coronary artery disease    COVID 04/2019   tested positive but no symptoms   GERD (gastroesophageal reflux disease)    Hypertension     Past Surgical History:  Procedure Laterality Date   BACK SURGERY     CATARACT EXTRACTION Left    COLONOSCOPY  03/19/2011   Procedure: COLONOSCOPY;  Surgeon: Ruby Corporal, MD;  Location: [facility];  Service: Endoscopy;  Laterality: N/A;   CORONARY ARTERY BYPASS GRAFT N/A 11/13/2021   Procedure: CORONARY ARTERY BYPASS GRAFTING (CABG) X FOUR BYPASSES USING OPEN LEFT INTERNAL MAMMARY ARTERY AND ENDOSCOPIC RIGHT GREATER SAPHENOUS VEIN HARVEST.;  Surgeon: Hilarie Lovely, MD;  Location: [facility] OR;  Service: Open Heart Surgery;  Laterality: N/A;   EYE SURGERY Left    for hole in eye   LEFT HEART CATH AND CORONARY ANGIOGRAPHY N/A 10/30/2021   Procedure: LEFT HEART CATH AND CORONARY ANGIOGRAPHY;  Surgeon: Arnoldo Lapping, MD;  Location: [facility] INVASIVE CV LAB;  Service: Cardiovascular;  Laterality: N/A;   SHOULDER SURGERY Left    skin cancer removed from face     TEE WITHOUT CARDIOVERSION N/A 11/13/2021   Procedure: TRANSESOPHAGEAL ECHOCARDIOGRAM (TEE);  Surgeon: Hilarie Lovely, MD;  Location: [facility]  OR;  Service: Open Heart Surgery;  Laterality: N/A;   UMBILICAL HERNIA REPAIR N/A 09/15/2014   Procedure: HERNIA REPAIR UMBILICAL ADULT;  Surgeon: III Rhina Center, MD;  Location: [facility] ORS;  Service: General;  Laterality: N/A;   VASECTOMY      Outpatient Medications Prior to Visit  Medication Sig Dispense Refill   albuterol  (VENTOLIN  HFA) 108 (90 Base) MCG/ACT inhaler Inhale 2 puffs into the lungs every 4 (four) hours as needed for wheezing or shortness of breath. 8 g 4   aspirin  EC 81 MG tablet Take 1 tablet (81 mg total) by mouth daily. Swallow whole. 90 tablet 3   ezetimibe  (ZETIA ) 10 MG tablet TAKE 1 TABLET BY MOUTH EVERY DAY 30 tablet 11   famotidine  (PEPCID ) 20 MG tablet Take 1 tablet (20 mg total) by mouth 2 (two) times daily. 180 tablet 3   metoprolol  succinate (TOPROL -XL) 50 MG 24 hr tablet TAKE 1 TABLET BY MOUTH DAILY. TAKE WITH OR IMMEDIATELY FOLLOWING A MEAL. 30 tablet 11   Multiple Vitamins-Minerals (PRESERVISION AREDS PO) Take 1 Capful by mouth in the morning and at bedtime.     rosuvastatin  (CRESTOR ) 40 MG tablet Take 1 tablet (40 mg total) by mouth daily. 90 tablet 3   fluticasone  (FLONASE ) 50 MCG/ACT nasal spray Place 2 sprays into both nostrils 2 (two) times daily as needed for allergies or rhinitis. (Patient not taking: Reported on 09/15/2023) 16 g  0   Multiple Vitamins-Minerals (MULTIVITAMINS THER. W/MINERALS) TABS Take 1 tablet by mouth daily.     nitroGLYCERIN  (NITROSTAT ) 0.4 MG SL tablet Place 1 tablet (0.4 mg total) under the tongue every 5 (five) minutes as needed for chest pain. 90 tablet 3   No facility-administered medications prior to visit.    ROS Review of Systems  HENT:  Negative for hearing loss.   Eyes:  Negative for visual disturbance.  Respiratory:  Negative for shortness of breath.   Cardiovascular:  Negative for chest pain and leg swelling.  Gastrointestinal:  Negative for constipation, diarrhea and nausea.  Musculoskeletal: Negative.   Neurological:   Negative for dizziness and headaches.  Psychiatric/Behavioral:  Negative for sleep disturbance.     Objective:  BP 124/80 (BP Location: Right Arm, Patient Position: Sitting, Cuff Size: Normal)   Pulse 71   Temp (!) 97.5 F (36.4 C)   SpO2 94%   Physical Exam Constitutional:      General: He is not in acute distress. HENT:     Head: Normocephalic.     Right Ear: Tympanic membrane, ear canal and external ear normal.     Left Ear: Tympanic membrane, ear canal and external ear normal.     Mouth/Throat:     Mouth: Mucous membranes are moist.  Eyes:     Pupils: Pupils are equal, round, and reactive to light.  Cardiovascular:     Rate and Rhythm: Normal rate and regular rhythm.     Heart sounds: Normal heart sounds.  Pulmonary:     Effort: Pulmonary effort is normal.     Breath sounds: Normal breath sounds.  Abdominal:     General: Bowel sounds are normal.  Musculoskeletal:        General: Normal range of motion.     Right lower leg: No edema.     Left lower leg: No edema.  Skin:    General: Skin is warm.  Neurological:     General: No focal deficit present.     Mental Status: He is alert and oriented to person, place, and time.  Psychiatric:        Mood and Affect: Mood normal.        Behavior: Behavior normal.      Assessment & Plan:    Diagnoses and all orders for this visit:  Participant in health and wellness plan Adult wellness physical was conducted today. Importance of diet and exercise were discussed in detail.  We reviewed immunizations and gave recommendations regarding current immunization needed for age.  Preventative health exams needed: Colonoscopy- discuss referral with PCP.   Patient was advised yearly wellness exam and follow-up with PCP as scheduled.     No orders of the defined types were placed in this encounter.   No orders of the defined types were placed in this encounter.   Follow-up:as needed

## 2024-01-17 ENCOUNTER — Other Ambulatory Visit: Payer: Self-pay | Admitting: Family Medicine

## 2024-01-17 DIAGNOSIS — K219 Gastro-esophageal reflux disease without esophagitis: Secondary | ICD-10-CM

## 2024-01-26 ENCOUNTER — Other Ambulatory Visit: Payer: Self-pay | Admitting: General Practice

## 2024-01-26 MED ORDER — METOPROLOL SUCCINATE ER 50 MG PO TB24: 50.0000 mg | ORAL_TABLET | Freq: Every day | ORAL | 0 refills | Status: DC

## 2024-01-26 MED ORDER — EZETIMIBE 10 MG PO TABS: 10.0000 mg | ORAL_TABLET | Freq: Every day | ORAL | 0 refills | Status: DC

## 2024-02-17 ENCOUNTER — Other Ambulatory Visit: Payer: Self-pay | Admitting: Family Medicine

## 2024-02-23 ENCOUNTER — Other Ambulatory Visit: Payer: Self-pay | Admitting: Physician Assistant

## 2024-03-07 ENCOUNTER — Other Ambulatory Visit: Payer: Self-pay | Admitting: General Practice

## 2024-03-10 ENCOUNTER — Telehealth: Payer: Self-pay | Admitting: Student in an Organized Health Care Education/Training Program

## 2024-03-10 MED ORDER — EZETIMIBE 10 MG PO TABS: 10.0000 mg | ORAL_TABLET | Freq: Every day | ORAL | 0 refills | Status: DC

## 2024-03-10 MED ORDER — METOPROLOL SUCCINATE ER 50 MG PO TB24: 50.0000 mg | ORAL_TABLET | Freq: Every day | ORAL | 0 refills | Status: DC

## 2024-03-10 NOTE — Telephone Encounter (Signed)
*  STAT* If patient is at the pharmacy, call can be transferred to refill team.   1. Which medications need to be refilled? (please list name of each medication and dose if known) metoprolol  succinate (TOPROL -XL) 50 MG 24 hr tablet   ezetimibe  (ZETIA ) 10 MG tablet   2. Which pharmacy/location (including street and city if local pharmacy) is medication to be sent to?  [id number] - [place], [place] - [address] AT [facility] [facility] [facility]      3. Do they need a 30 day or 90 day supply? 90 day    Pt is out of medication and has office visit scheduled on 03/17/24.

## 2024-03-10 NOTE — Telephone Encounter (Signed)
 Pt scheduled to see Dr. Floretta 03/17/24.  Another 15 day refill of Metoprolol  and Zetia  has been sent to [facility].

## 2024-03-16 NOTE — Progress Notes (Signed)
 Cardiology Office Note:   Date:  03/16/2024  ID:  Mark [name]., DOB [birth year], MRN [id number] PCP: Mark [name] LABOR, MD  [name] Providers Cardiologist:  Mark Archer, MD Chief Complaint:  Chief Complaint  Patient presents with   Follow-up      History of Present Illness:   Mark [name]. is a 63 y.o. male with a PMH of CAD s/p 4V CABG (LIMA-LAD, SVG to OM, SVG to Diag, SVG PAV) and HLD who presents for follow up.  The patient was last seen 1 year ago by Dr. Ronal Ross.  Patient is doing well today and has no complaints.  He is taking all of his medications as prescribed without limitation or side effect.  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  Denies smoking illicit drug use.  Does indulge in alcohol consumption but drinks about 12 EtOH beverages in a week.  Denies chest pain, SOB, PND, orthopnea, swelling, palpitations, syncope and presyncope.   Past Medical History:  Diagnosis Date   Cancer (HCC) 15-20 years ago   squamous cell carcinoma off face   Coronary artery disease    COVID 04/2019   tested positive but no symptoms   GERD (gastroesophageal reflux disease)    Hypertension      Studies Reviewed:    EKG:  EKG Interpretation Date/Time:  Thursday March 17 2024 08:40:06 EST Ventricular Rate:  71 PR Interval:  180 QRS Duration:  82 QT Interval:  408 QTC Calculation: 443 R Axis:   6  Text Interpretation: Normal sinus rhythm Minimal voltage criteria for LVH, may be normal variant ( R in aVL ) Inferior infarct Confirmed by Mark [name] [phone]) on 03/17/2024 8:49:50 AM     Cardiac Studies & Procedures   ______________________________________________________________________________________________ CARDIAC CATHETERIZATION  CARDIAC CATHETERIZATION 10/30/2021  Conclusion 1.  Patent left main stem with no significant stenosis 2.  Severe 70 to 75% proximal LAD stenosis with critical 95% first  diagonal stenosis 3.  Moderate stenosis in the mid circumflex into the second OM and severe 90% stenosis of the first OM 4.  Nonobstructive RCA stenosis with severe 95% stenosis of the first posterolateral branch (largest of the distal RCA branch vessels)  Recommend outpatient cardiac surgical consultation for consideration of CABG.  PCI could also be performed in this patient with a low syntax score but he would require multivessel stenting of the LAD, first diagonal, first obtuse marginal, and first posterolateral branch of the RCA.  I think he would likely do best long-term with multivessel CABG.  We will start metoprolol  25 mg twice daily and he will otherwise continue on his current medications.  Findings Coronary Findings Diagnostic  Dominance: Right  Left Anterior Descending The LAD is a large vessel that wraps around the LV apex.  The proximal LAD has moderately severe stenosis leading up to his severe disease just before the diagonal branch.  The diagonal branch has very severe 95% stenosis.  The mid and apical portions of the LAD are widely patent. Ost LAD to Prox LAD lesion is 50% stenosed. Prox LAD lesion is 75% stenosed.  First Diagonal Branch 1st Diag lesion is 95% stenosed.  Left Circumflex  First Obtuse Marginal Branch 1st Mrg lesion is 90% stenosed. This is a medium caliber vessel  Second Obtuse Marginal Branch 2nd Mrg lesion is 50% stenosed.  Right Coronary Artery Prox RCA lesion is 30% stenosed. Dist RCA lesion is 50% stenosed.  Right Posterior  Descending Artery Vessel is small in size.  First Right Posterolateral Branch Vessel is large in size. 1st RPL lesion is 95% stenosed. This is the largest of the RCA subbranches  Intervention  No interventions have been documented.   STRESS TESTS  MYOCARDIAL PERFUSION IMAGING 09/27/2021  Interpretation Summary   Findings are consistent with no prior ischemia and no prior myocardial infarction. The study is low  risk.   LV perfusion is normal. There is no evidence of ischemia. There is no evidence of infarction.   Left ventricular function is abnormal. Global function is mildly reduced. Nuclear stress EF: 52 %. The left ventricular ejection fraction is mildly decreased (45-54%). End diastolic cavity size is mildly enlarged.   Prior study not available for comparison.  Low risk myovue with no ischemia/infarction on perfusion images. Baseline ECG with inferior lateral T wave changes that worsened in stress with T wave inversions. Mildly reduced EF 52% with some apical hypokinesis   ECHOCARDIOGRAM  ECHOCARDIOGRAM COMPLETE 10/11/2021  Narrative ECHOCARDIOGRAM REPORT    Patient Name:   Mark [name]. Date of Exam: 10/11/2021 Medical Rec #:  [id number]          Height:       68.0 in Accession #:    [id number]         Weight:       176.0 lb Date of Birth:  12-25-1960          BSA:          1.936 m Patient Age:    61 years           BP:           133/91 mmHg Patient Gender: M                  HR:           88 bpm. Exam Location:  [name]  Procedure: 2D Echo, Cardiac Doppler and Color Doppler  Indications:    R06.09 (ICD-10-CM) - DOE (dyspnea on exertion)  History:        Patient has no prior history of Echocardiogram examinations. Risk Factors:Hypertension and Dyslipidemia. Cancer (HCC) (From Hx).  Sonographer:    [name] RCS Referring Phys: [phone] [name]  IMPRESSIONS   1. Left ventricular ejection fraction, by estimation, is 60 to 65%. The left ventricle has normal function. The left ventricle has no regional wall motion abnormalities. There is mild left ventricular hypertrophy. Left ventricular diastolic parameters are consistent with Grade I diastolic dysfunction (impaired relaxation). 2. Right ventricular systolic function is normal. The right ventricular size is normal. Tricuspid regurgitation signal is inadequate for assessing PA pressure. 3. The mitral valve is normal in  structure. No evidence of mitral valve regurgitation. No evidence of mitral stenosis. 4. The aortic valve is tricuspid. Aortic valve regurgitation is trivial. Aortic valve sclerosis is present, with no evidence of aortic valve stenosis. 5. The inferior vena cava is normal in size with greater than 50% respiratory variability, suggesting right atrial pressure of 3 mmHg.  FINDINGS Left Ventricle: Left ventricular ejection fraction, by estimation, is 60 to 65%. The left ventricle has normal function. The left ventricle has no regional wall motion abnormalities. The left ventricular internal cavity size was normal in size. There is mild left ventricular hypertrophy. Left ventricular diastolic parameters are consistent with Grade I diastolic dysfunction (impaired relaxation).  Right Ventricle: The right ventricular size is normal. No increase in right ventricular wall thickness.  Right ventricular systolic function is normal. Tricuspid regurgitation signal is inadequate for assessing PA pressure.  Left Atrium: Left atrial size was normal in size.  Right Atrium: Right atrial size was normal in size.  Pericardium: There is no evidence of pericardial effusion.  Mitral Valve: The mitral valve is normal in structure. No evidence of mitral valve regurgitation. No evidence of mitral valve stenosis.  Tricuspid Valve: The tricuspid valve is normal in structure. Tricuspid valve regurgitation is trivial.  Aortic Valve: The aortic valve is tricuspid. Aortic valve regurgitation is trivial. Aortic valve sclerosis is present, with no evidence of aortic valve stenosis.  Pulmonic Valve: The pulmonic valve was not well visualized. Pulmonic valve regurgitation is trivial.  Aorta: The aortic root is normal in size and structure.  Venous: The inferior vena cava is normal in size with greater than 50% respiratory variability, suggesting right atrial pressure of 3 mmHg.  IAS/Shunts: The interatrial septum was not  well visualized.   LEFT VENTRICLE PLAX 2D LVIDd:         4.40 cm   Diastology LVIDs:         2.80 cm   LV e' medial:    5.44 cm/s LV PW:         1.00 cm   LV E/e' medial:  9.6 LV IVS:        1.10 cm   LV e' lateral:   4.57 cm/s LVOT diam:     2.20 cm   LV E/e' lateral: 11.4 LV SV:         67 LV SV Index:   35 LVOT Area:     3.80 cm   RIGHT VENTRICLE RV S prime:     14.70 cm/s TAPSE (M-mode): 2.6 cm  LEFT ATRIUM             Index        RIGHT ATRIUM           Index LA diam:        3.40 cm 1.76 cm/m   RA Area:     17.60 cm LA Vol (A2C):   68.1 ml 35.18 ml/m  RA Volume:   47.00 ml  24.28 ml/m LA Vol (A4C):   60.8 ml 31.41 ml/m LA Biplane Vol: 65.6 ml 33.89 ml/m AORTIC VALVE LVOT Vmax:   91.20 cm/s LVOT Vmean:  57.600 cm/s LVOT VTI:    0.176 m  AORTA Ao Root diam: 3.60 cm  MITRAL VALVE MV Area (PHT): 3.08 cm    SHUNTS MV Decel Time: 246 msec    Systemic VTI:  0.18 m MV E velocity: 52.30 cm/s  Systemic Diam: 2.20 cm MV A velocity: 79.30 cm/s MV E/A ratio:  0.66  Lonni Nanas MD Electronically signed by Lonni Nanas MD Signature Date/Time: 10/11/2021/10:50:31 AM    Final   TEE  ECHO INTRAOPERATIVE TEE 11/13/2021  Narrative *INTRAOPERATIVE TRANSESOPHAGEAL REPORT *    Patient Name:   Mark Andujo. Date of Exam: 11/13/2021 Medical Rec #:  [id number]          Height:       68.0 in Accession #:    [id number]         Weight:       175.0 lb Date of Birth:  January 16, 1961          BSA:          1.93 m Patient Age:    31 years  BP:           117/90 mmHg Patient Gender: M                  HR:           64 bpm. Exam Location:  Inpatient  Transesophogeal exam was perform intraoperatively during surgical procedure. Patient was closely monitored under general anesthesia during the entirety of examination.  Indications:     coronary artery bypass surgery Performing Phys: [id number] LINNIE KIDD LIGHTFOOT Diagnosing Phys: Cordella  Stoltzfus  Complications: No known complications during this procedure. POST-OP IMPRESSIONS _ Left Ventricle: The left ventricle is unchanged from pre-bypass. _ Right Ventricle: The right ventricle appears unchanged from pre-bypass. _ Aorta: The aorta appears unchanged from pre-bypass. _ Left Atrial Appendage: The left atrial appendage appears unchanged from pre-bypass. _ Aortic Valve: The aortic valve appears unchanged from pre-bypass. _ Mitral Valve: The mitral valve appears unchanged from pre-bypass. _ Tricuspid Valve: The tricuspid valve appears unchanged from pre-bypass. _ Pulmonic Valve: The pulmonic valve appears unchanged from pre-bypass. _ Interatrial Septum: The interatrial septum appears unchanged from pre-bypass. _ Comments: S/P CABG. Preserved EF. No new or worsening wall motion or valvular abnormalities.  PRE-OP FINDINGS Left Ventricle: The left ventricle has normal systolic function, with an ejection fraction of 55-60%. The cavity size was normal. No evidence of left ventricular regional wall motion abnormalities. There is no left ventricular hypertrophy. Left ventricular diastolic parameters were normal.   Right Ventricle: The right ventricle has normal systolic function. The cavity was normal. There is no increase in right ventricular wall thickness.  Left Atrium: Left atrial size was normal in size. No left atrial/left atrial appendage thrombus was detected. Left atrial appendage velocity is reduced at less than 40 cm/s.  Right Atrium: Right atrial size was normal in size.  Interatrial Septum: No atrial level shunt detected by color flow Doppler. There is no evidence of a patent foramen ovale.  Pericardium: There is no evidence of pericardial effusion. There is no pleural effusion.  Mitral Valve: The mitral valve is normal in structure. Mitral valve regurgitation is not visualized by color flow Doppler. There is no evidence of mitral valve vegetation. There is No  evidence of mitral stenosis.  Tricuspid Valve: The tricuspid valve was normal in structure. Tricuspid valve regurgitation was not visualized by color flow Doppler. There is no evidence of tricuspid valve vegetation.  Aortic Valve: The aortic valve is tricuspid Aortic valve regurgitation was not visualized by color flow Doppler. There is no stenosis of the aortic valve, with a calculated valve area of 1.80 cm. There is no evidence of aortic valve vegetation.   Pulmonic Valve: The pulmonic valve was normal in structure. Pulmonic valve regurgitation is not visualized by color flow Doppler.   Aorta: The aortic root and ascending aorta are normal in size and structure.  Pulmonary Artery: The pulmonary artery is of normal size.  Venous: The inferior vena cava is normal in size with greater than 50% respiratory variability, suggesting right atrial pressure of 3 mmHg.  Shunts: There is no evidence of an atrial septal defect.  +--------------+--------++ LEFT VENTRICLE          +----------------+---------++ +--------------+--------++  Diastology                PLAX 2D                 +----------------+---------++ +--------------+--------++  LV e' lateral:  9.77 cm/s LVOT diam:    2.10  cm   +----------------+---------++ +--------------+--------++  LV E/e' lateral:6.1       LVOT Area:    3.46 cm  +----------------+---------++ +--------------+--------++  LV e' medial:   7.61 cm/s                         +----------------+---------++ +--------------+--------++  LV E/e' medial: 7.8       +----------------+---------++  +----------------------+-------++ 2D Longitudinal Strain        +----------------------+-------++ 2D Strain GLS (A2C):  -22.8 % +----------------------+-------++ 2D Strain GLS (A3C):  -20.3 % +----------------------+-------++ 2D Strain GLS (A4C):  -11.0 % +----------------------+-------++ 2D Strain GLS Avg:    -18.0  % +----------------------+-------++  +------------+--------++ 3D Volume EF         +------------+--------++ LV 3D EDV:  60.45 ml +------------+--------++ LV 3D ESV:  29.58 ml +------------+--------++  +------------------+-----------++ AORTIC VALVE                  +------------------+-----------++ AV Area (Vmax):   1.88 cm    +------------------+-----------++ AV Area (Vmean):  1.92 cm    +------------------+-----------++ AV Area (VTI):    1.80 cm    +------------------+-----------++ AV Vmax:          110.00 cm/s +------------------+-----------++ AV Vmean:         75.100 cm/s +------------------+-----------++ AV VTI:           0.270 m     +------------------+-----------++ AV Peak Grad:     4.8 mmHg    +------------------+-----------++ AV Mean Grad:     3.0 mmHg    +------------------+-----------++ LVOT Vmax:        59.70 cm/s  +------------------+-----------++ LVOT Vmean:       41.700 cm/s +------------------+-----------++ LVOT VTI:         0.140 m     +------------------+-----------++ LVOT/AV VTI ratio:0.52        +------------------+-----------++  +--------------+-------++ AORTA                 +--------------+-------++ Ao Sinus diam:3.40 cm +--------------+-------++ Ao STJ diam:  2.8 cm  +--------------+-------++ Ao Asc diam:  3.30 cm +--------------+-------++  +--------------+----------++ MITRAL VALVE              +--------------+-------+ +--------------+----------++  SHUNTS                MV Area (PHT):3.33 cm    +--------------+-------+ +--------------+----------++  Systemic VTI: 0.14 m  MV Peak grad: 1.4 mmHg    +--------------+-------+ +--------------+----------++  Systemic Diam:2.10 cm MV Mean grad: 0.0 mmHg    +--------------+-------+ +--------------+----------++ MV Vmax:      0.59 m/s   +--------------+----------++ MV Vmean:      31.0 cm/s  +--------------+----------++ MV VTI:       0.19 m     +--------------+----------++ MV PHT:       66.12 msec +--------------+----------++ MV Decel Time:228 msec   +--------------+----------++ +--------------+----------++ MV E velocity:59.30 cm/s +--------------+----------++ MV A velocity:48.80 cm/s +--------------+----------++ MV E/A ratio: 1.22       +--------------+----------++   Cordella Fix Electronically signed by Cordella Fix Signature Date/Time: 11/25/2021/7:40:13 PM    Final    CT SCANS  CT CORONARY FRACTIONAL FLOW RESERVE DATA PREP 10/24/2021  Narrative EXAM: CT FFR ANALYSIS  CLINICAL DATA:  Abnormal CCTA  FINDINGS: FFRct analysis was performed on the original cardiac CT angiogram dataset. Diagrammatic representation of the FFRct analysis is provided in a separate PDF document in PACS. This dictation was created using the PDF document and an  interactive 3D model of the results. 3D model is not available in the EMR/PACS. Normal FFR range is >0.80.  1. Left Main:  No significant stenosis.  2. LAD: There is significant stenosis in the proximal LAD. FFRct 0.78. 3. LCX: There is significant stenosis in the proximal OM1. FFRct 0.76. 4. RCA: No significant stenosis.  FFRct 0.94.  IMPRESSION: 1. CT FFR analysis showed significant stenosis in the proximal LAD and OM1.  2.  Recommend cardiac catheterization.   Electronically Signed By: Redell Cave M.D. On: 10/25/2021 12:34   CT CORONARY MORPH W/CTA COR W/SCORE 10/24/2021  Addendum 10/24/2021  4:42 PM ADDENDUM REPORT: 10/24/2021 16:39  CLINICAL DATA:  This over-read does not include interpretation of cardiac or coronary anatomy or pathology. The coronary CTA interpretation by the cardiologist is attached.  COMPARISON:  None available.  FINDINGS: No suspicious nodules, masses, or infiltrates are identified in the visualized portion of the  lungs. No pleural fluid seen.  The visualized portions of the mediastinum and chest wall are unremarkable. A few tiny benign cysts are noted in the visualized portion of the liver.  IMPRESSION: No significant non-cardiac abnormality identified.   Electronically Signed By: Norleen DELENA Kil M.D. On: 10/24/2021 16:39  Narrative CLINICAL DATA:  Chest pain and shortness of breath  EXAM: Cardiac/Coronary  CTA  TECHNIQUE: The patient was scanned on a Siemens Somatom go.Top scanner.  : A retrospective scan was triggered in the descending thoracic aorta. Axial non-contrast 3 mm slices were carried out through the heart. The data set was analyzed on a dedicated work station and scored using the Agatson method. Gantry rotation speed was 330 msecs and collimation was .6 mm. 100mg  of metoprolol  and 0.8 mg of sl NTG was given. The 3D data set was reconstructed in 5% intervals of the 60-95 % of the R-R cycle. Diastolic phases were analyzed on a dedicated work station using MPR, MIP and VRT modes. The patient received 75 cc of contrast.  FINDINGS: Aorta: Normal size. Mild aortic root and descending aorta calcifications. No dissection.  Aortic Valve:  Trileaflet.  No calcifications.  Coronary Arteries:  Normal coronary origin.  Right dominance.  RCA is a dominant artery that gives rise to PDA and PLA. There is calcified plaque in the proximal and mid RCA causing mild stenosis (25%).  Left main gives rise to LAD and LCX arteries.  LM has no disease  LAD has heavily calcified plaque in the proximal segment causing severe stenosis (>70%).  LCX is a non-dominant artery that gives rise to three obtuse marginal branches. There is calcified plaque in the ostial OM1 causing severe stenosis (>70%).  Other findings:  Normal pulmonary vein drainage into the left atrium.  Normal left atrial appendage without a thrombus.  Normal size of the pulmonary artery.  IMPRESSION: 1. Coronary  calcium  score of 1329. This was 97th percentile for age and sex matched control.  2. Normal coronary origin with right dominance.  3. Calcified plaque causing severe stenosis (>70%) in the proximal LAD and ostial OM1.  4. Mild stenosis in the mid RCA (25%).  5. CAD-RADS 4 Severe stenosis. (70-99% or > 50% left main). Cardiac catheterization is recommended. Consider symptom-guided anti-ischemic pharmacotherapy as well as risk factor modification per guideline directed care.  6. Additional analysis with CT FFR will be submitted and reported separately.  Electronically Signed: By: Redell Cave M.D. On: 10/24/2021 15:03     ______________________________________________________________________________________________      Risk Assessment/Calculations:  Physical Exam:     VS:  BP (!) 130/90   Pulse 71   Ht 5' 8 (1.727 m)   Wt 176 lb (79.8 kg)   SpO2 100%   BMI 26.76 kg/m      Wt Readings from Last 3 Encounters:  05/05/23 174 lb (78.9 kg)  02/05/23 173 lb 6.4 oz (78.7 kg)  01/19/23 174 lb 3.2 oz (79 kg)     GEN: Well nourished, well developed, in no acute distress NECK: No JVD; No carotid bruits CARDIAC: RRR, no murmurs, rubs, gallops RESPIRATORY:  Clear to auscultation without rales, wheezing or rhonchi  ABDOMEN: Soft, non-tender, non-distended, normal bowel sounds EXTREMITIES:  Warm and well perfused, no edema; No deformity, 2+ radial pulses PSYCH: Normal mood and affect   Assessment & Plan CAD, multiple vessel - Doing well from a CAD standpoint. -Denies chest pain or other anginal equivalents. -Will refill medications today. Continue aspirin  81 mg daily Continue metoprolol  succinate 50 mg daily Check CMP Check CBC Refilling cardiac meds today Follow-up in 12 months S/P CABG x 4 Same as above Mixed hyperlipidemia - I will target LDL less than 55 for this gentleman given his 4V CABG history. Check lipid panel Continue Crestor  40 mg  daily Continue Zetia  10 mg daily Primary hypertension - Blood pressure was a little bit elevated today, but the patient drink coffee this morning and was rushing into the office. - Previous 2 blood pressures have been perfect and he says that at home his blood pressures are normally fine. - I will not make any adjustments at this time; however, if additional blood pressure control is needed in the future would favor an ACEI or ARB          This note was written with the assistance of a dictation microphone or AI dictation software. Please excuse any typos or grammatical errors.   Signed, Mark Archer, MD 03/16/2024 11:04 AM    [facility]

## 2024-03-16 NOTE — Assessment & Plan Note (Signed)
-   Check lipid panel Continue Crestor  40 mg daily Continue Zetia  10 mg daily

## 2024-03-16 NOTE — Assessment & Plan Note (Signed)
 Same as above

## 2024-03-17 ENCOUNTER — Encounter: Payer: Self-pay | Admitting: Student in an Organized Health Care Education/Training Program

## 2024-03-17 ENCOUNTER — Ambulatory Visit
Payer: PRIVATE HEALTH INSURANCE | Attending: Student in an Organized Health Care Education/Training Program | Admitting: Student in an Organized Health Care Education/Training Program

## 2024-03-17 VITALS — BP 130/90 | HR 71 | Ht 68.0 in | Wt 176.0 lb

## 2024-03-17 DIAGNOSIS — I10 Essential (primary) hypertension: Secondary | ICD-10-CM

## 2024-03-17 DIAGNOSIS — E782 Mixed hyperlipidemia: Secondary | ICD-10-CM | POA: Diagnosis not present

## 2024-03-17 DIAGNOSIS — I2510 Atherosclerotic heart disease of native coronary artery without angina pectoris: Secondary | ICD-10-CM

## 2024-03-17 DIAGNOSIS — Z79899 Other long term (current) drug therapy: Secondary | ICD-10-CM

## 2024-03-17 DIAGNOSIS — Z951 Presence of aortocoronary bypass graft: Secondary | ICD-10-CM

## 2024-03-17 LAB — CBC

## 2024-03-17 MED ORDER — EZETIMIBE 10 MG PO TABS: 10.0000 mg | ORAL_TABLET | Freq: Every day | ORAL | 3 refills | Status: AC

## 2024-03-17 MED ORDER — NITROGLYCERIN 0.4 MG SL SUBL: 0.4000 mg | SUBLINGUAL_TABLET | SUBLINGUAL | 3 refills | Status: AC | PRN

## 2024-03-17 MED ORDER — METOPROLOL SUCCINATE ER 50 MG PO TB24: 50.0000 mg | ORAL_TABLET | Freq: Every day | ORAL | 3 refills | Status: AC

## 2024-03-17 MED ORDER — ASPIRIN 81 MG PO TBEC: 81.0000 mg | DELAYED_RELEASE_TABLET | Freq: Every day | ORAL | 3 refills | Status: AC

## 2024-03-17 NOTE — Assessment & Plan Note (Signed)
-   Blood pressure was a little bit elevated today, but the patient drink coffee this morning and was rushing into the office. - Previous 2 blood pressures have been perfect and he says that at home his blood pressures are normally fine. - I will not make any adjustments at this time; however, if additional blood pressure control is needed in the future would favor an ACEI or ARB

## 2024-03-17 NOTE — Patient Instructions (Signed)
 Medication Instructions:  Your physician recommends that you continue on your current medications as directed. Please refer to the Current Medication list given to you today.  *If you need a refill on your cardiac medications before your next appointment, please call your pharmacy*  Lab Work: TODAY: CMP, LIPID, CBC If you have labs (blood work) drawn today and your tests are completely normal, you will receive your results only by: MyChart Message (if you have MyChart) OR A paper copy in the mail If you have any lab test that is abnormal or we need to change your treatment, we will call you to review the results.  Testing/Procedures: NONE  Follow-Up: At [facility], you and your health needs are our priority.  As part of our continuing mission to provide you with exceptional heart care, our providers are all part of one team.  This team includes your primary Cardiologist (physician) and Advanced Practice Providers or APPs (Physician Assistants and Nurse Practitioners) who all work together to provide you with the care you need, when you need it.  Your next appointment:   1 year(s)  Provider:   Dr Floretta

## 2024-03-17 NOTE — Assessment & Plan Note (Signed)
-   Doing well from a CAD standpoint. -Denies chest pain or other anginal equivalents. -Will refill medications today. Continue aspirin  81 mg daily Continue metoprolol  succinate 50 mg daily Check CMP Check CBC Refilling cardiac meds today Follow-up in 12 months

## 2024-03-18 LAB — COMPREHENSIVE METABOLIC PANEL WITH GFR
ALT: 46 IU/L — ABNORMAL HIGH (ref 0–44)
AST: 34 IU/L (ref 0–40)
Albumin: 4.4 g/dL (ref 3.9–4.9)
Alkaline Phosphatase: 67 IU/L (ref 47–123)
BUN/Creatinine Ratio: 16 (ref 10–24)
BUN: 16 mg/dL (ref 8–27)
Bilirubin Total: 0.4 mg/dL (ref 0.0–1.2)
CO2: 22 mmol/L (ref 20–29)
Calcium: 9.6 mg/dL (ref 8.6–10.2)
Chloride: 102 mmol/L (ref 96–106)
Creatinine, Ser: 1 mg/dL (ref 0.76–1.27)
Globulin, Total: 2.7 g/dL (ref 1.5–4.5)
Glucose: 104 mg/dL — ABNORMAL HIGH (ref 70–99)
Potassium: 4.8 mmol/L (ref 3.5–5.2)
Sodium: 139 mmol/L (ref 134–144)
Total Protein: 7.1 g/dL (ref 6.0–8.5)
eGFR: 84 mL/min/1.73 (ref >59)

## 2024-03-18 LAB — LIPID PANEL
Chol/HDL Ratio: 2.3 ratio (ref 0.0–5.0)
Cholesterol, Total: 153 mg/dL (ref 100–199)
HDL: 68 mg/dL (ref >39)
LDL Chol Calc (NIH): 69 mg/dL (ref 0–99)
Triglycerides: 83 mg/dL (ref 0–149)
VLDL Cholesterol Cal: 16 mg/dL (ref 5–40)

## 2024-03-18 LAB — CBC
Hematocrit: 42.3 % (ref 37.5–51.0)
Hemoglobin: 13.9 g/dL (ref 13.0–17.7)
MCH: 32.3 pg (ref 26.6–33.0)
MCHC: 32.9 g/dL (ref 31.5–35.7)
MCV: 98 fL — AB (ref 79–97)
Platelets: 211 x10E3/uL (ref 150–450)
RBC: 4.31 x10E6/uL (ref 4.14–5.80)
RDW: 11.9 % (ref 11.6–15.4)
WBC: 5.8 x10E3/uL (ref 3.4–10.8)

## 2024-03-21 ENCOUNTER — Ambulatory Visit: Payer: Self-pay | Admitting: Student in an Organized Health Care Education/Training Program

## 2024-04-15 ENCOUNTER — Other Ambulatory Visit: Payer: Self-pay | Admitting: Family Medicine

## 2024-04-15 DIAGNOSIS — K219 Gastro-esophageal reflux disease without esophagitis: Secondary | ICD-10-CM
